# Patient Record
Sex: Male | Born: 1950 | Race: White | Hispanic: No | Marital: Single | Smoking: Never smoker
Health system: Southern US, Community
[De-identification: ages and names within clinical notes are randomized; demographics above are authoritative.]

## PROBLEM LIST (undated history)

## (undated) DIAGNOSIS — E785 Hyperlipidemia, unspecified: Secondary | ICD-10-CM

## (undated) DIAGNOSIS — I499 Cardiac arrhythmia, unspecified: Secondary | ICD-10-CM

## (undated) DIAGNOSIS — E039 Hypothyroidism, unspecified: Secondary | ICD-10-CM

## (undated) HISTORY — DX: Cardiac arrhythmia, unspecified: I49.9

## (undated) HISTORY — PX: NO PAST SURGERIES: SHX2092

## (undated) HISTORY — DX: Hyperlipidemia, unspecified: E78.5

## (undated) HISTORY — DX: Hypothyroidism, unspecified: E03.9

---

## 2014-02-18 ENCOUNTER — Encounter: Payer: Self-pay | Admitting: Family Medicine

## 2014-02-18 ENCOUNTER — Ambulatory Visit (INDEPENDENT_AMBULATORY_CARE_PROVIDER_SITE_OTHER): Payer: Medicare HMO | Admitting: Family Medicine

## 2014-02-18 VITALS — BP 125/87 | HR 72 | Temp 97.9°F | Ht 71.0 in | Wt 170.0 lb

## 2014-02-18 DIAGNOSIS — Z125 Encounter for screening for malignant neoplasm of prostate: Secondary | ICD-10-CM

## 2014-02-18 DIAGNOSIS — E039 Hypothyroidism, unspecified: Secondary | ICD-10-CM | POA: Insufficient documentation

## 2014-02-18 DIAGNOSIS — Z716 Tobacco abuse counseling: Secondary | ICD-10-CM | POA: Insufficient documentation

## 2014-02-18 DIAGNOSIS — F17200 Nicotine dependence, unspecified, uncomplicated: Secondary | ICD-10-CM

## 2014-02-18 DIAGNOSIS — Z130 Encounter for screening for diseases of the blood and blood-forming organs and certain disorders involving the immune mechanism: Secondary | ICD-10-CM

## 2014-02-18 DIAGNOSIS — E785 Hyperlipidemia, unspecified: Secondary | ICD-10-CM | POA: Insufficient documentation

## 2014-02-18 DIAGNOSIS — Z7189 Other specified counseling: Secondary | ICD-10-CM

## 2014-02-18 DIAGNOSIS — Z6825 Body mass index (BMI) 25.0-25.9, adult: Secondary | ICD-10-CM | POA: Insufficient documentation

## 2014-02-18 DIAGNOSIS — Z79899 Other long term (current) drug therapy: Secondary | ICD-10-CM

## 2014-02-18 LAB — COMPREHENSIVE METABOLIC PANEL
ALT: 15 U/L (ref 0–53)
AST: 15 U/L (ref 0–37)
Albumin: 4.5 g/dL (ref 3.5–5.2)
Alkaline Phosphatase: 68 U/L (ref 39–117)
BUN: 16 mg/dL (ref 6–23)
CALCIUM: 9.4 mg/dL (ref 8.4–10.5)
CO2: 26 meq/L (ref 19–32)
CREATININE: 1.02 mg/dL (ref 0.50–1.35)
Chloride: 106 mEq/L (ref 96–112)
Glucose, Bld: 91 mg/dL (ref 70–99)
POTASSIUM: 4.1 meq/L (ref 3.5–5.3)
Sodium: 141 mEq/L (ref 135–145)
TOTAL PROTEIN: 6.8 g/dL (ref 6.0–8.3)
Total Bilirubin: 0.8 mg/dL (ref 0.2–1.2)

## 2014-02-18 LAB — LIPID PANEL
CHOL/HDL RATIO: 3.2 ratio
Cholesterol: 165 mg/dL (ref 0–200)
HDL: 52 mg/dL (ref >39)
LDL Cholesterol: 90 mg/dL (ref 0–99)
Triglycerides: 115 mg/dL (ref <150)
VLDL: 23 mg/dL (ref 0–40)

## 2014-02-18 LAB — CBC
HCT: 45.3 % (ref 39.0–52.0)
Hemoglobin: 15.8 g/dL (ref 13.0–17.0)
MCH: 30 pg (ref 26.0–34.0)
MCHC: 34.9 g/dL (ref 30.0–36.0)
MCV: 86.1 fL (ref 78.0–100.0)
PLATELETS: 192 10*3/uL (ref 150–400)
RBC: 5.26 MIL/uL (ref 4.22–5.81)
RDW: 13.4 % (ref 11.5–15.5)
WBC: 5.4 10*3/uL (ref 4.0–10.5)

## 2014-02-18 LAB — TSH: TSH: 1.733 u[IU]/mL (ref 0.350–4.500)

## 2014-02-18 NOTE — Assessment & Plan Note (Signed)
>>  ASSESSMENT AND PLAN FOR HLD (HYPERLIPIDEMIA) WRITTEN ON 02/18/2014  8:22 PM BY STRE[address], Stephanie Coup, MD  A: Compliant with Mevacor. No side effects. Not sure of last liver function, but last lab draw was a couple of years ago at prior provider.  P: Lipid panel (non-fasting) drawn today. Will evaluate current ASCVD risk and consider change if indicated. Otherwise continue Mevacor for now; pt will need refill soon, but will order when decision is made to change to a different agent, or not.

## 2014-02-18 NOTE — Patient Instructions (Signed)
Front desk: Please schedule Mr. Carlos [name] with [facility] for a welcome to medicare visit. Thanks! --CMS  Thank you for coming in, today! It was nice to meet you!  I will check several labs today and call or write you a letter with the results. Use the stool cards as instructed. If they come back positive, we'll refer you to the GI doctor. I'll use your lab results to send in prescriptions as you need.  Come back to see me as you need. Make an appointment to see Lamont Dowdy for your welcome to Medicare visit. Please feel free to call with any questions or concerns at any time, at [phone]. --Dr. Venetia Maxon

## 2014-02-18 NOTE — Assessment & Plan Note (Signed)
Current smokeless tobacco user (chew). Not interested in quitting at the moment, but potentially interested in stopping in the future. Briefly counseled on risks of use, including oropharyngeal cancers, etc. Will continue counseling regularly at follow up and will continue to encourage complete cessation.

## 2014-02-18 NOTE — Assessment & Plan Note (Signed)
A: Apparently stable on current dose of Synthroid, without active or worrisome symptoms. Unsure exactly when last TSH was checked, but was reportedly slightly high, so last PCP increased from 50 mcg to 75 several months ago.  P: TSH drawn today. Continue Synthroid for now; pt will need refill soon, but will wait to see what TSH is to determine whether dose needs to be adjusted.

## 2014-02-18 NOTE — Progress Notes (Signed)
Subjective:    Patient ID: Carlos [name], male    DOB: 05/19/1951, 63 y.o.   MRN: [id number]  HPI: Pt presents to clinic to establish care. He generally feels well. He requests "a full lab work-up" as he gets things done "every two years," and is "due" for this. He also has questions about a welcome to Medicare visit and vaccinations / health screenings.  Hypothyroidism - pt reports he has been on Synthroid 75 mcg from his previous physician; denies side effects - denies weight changes, hair changes, skin changes, hot/cold intolerance, easy bleeding or bruising, or change in bowel habits  HLD - reports compliance with Mevacor, daily; last lipid panel was 'a couple of years ago' with his prior blood work - states his cholesterol has "always" been between 190 and 200, even with Mevacor - denies history of liver dysfunction or side effects from [employer] maintenance - has all recommended immunizations to this point (see immunizations flowsheet) - has not had a colonoscopy but is hesitant to get one at this point, preferring stool cards - had a PSA of 0.9 in 2013, with a family history of prostate cancer (father - deceased of this at 58, metastatic to bones and brain)  Family History  Problem Relation Age of Onset  . Alcoholism Other     Mother and Father  . COPD Brother     Emphysema  . Diabetes Brother     neither required medicine  . Prostate cancer Father     Spread to bones  . Heart disease Other     Grandparents  . Early death Other     Mother, 60 (PNA); Father, 67 (cancer)  . Hypertension Brother   . Hyperlipidemia Brother   . Thyroid disease Other     Brother and grandfather   Past Medical History  Diagnosis Date  . HLD (hyperlipidemia)   . Irregular heartbeat     self-reported "slow heartbeat"  . Hypothyroidism    Past Surgical History  Procedure Laterality Date  . No past surgeries      as of 02/2014    History   Social History  . Marital  Status: Single    Spouse Name: N/A    Number of Children: N/A  . Years of Education: N/A   Occupational History  . Not on file.   Social History Main Topics  . Smoking status: Never Smoker   . Smokeless tobacco: Current User    Types: Chew  . Alcohol Use: Not on file  . Drug Use: Not on file  . Sexual Activity: Not on file   Other Topics Concern  . Not on file   Social History Narrative  . No narrative on file   Pt currently uses smokeless tobacco and is not interested in quitting. In addition to the above documentation, pt's PMH, surgical history, FH, and SH all reviewed and updated where appropriate in the EMR. I have also reviewed and updated the pt's allergies and current medications as appropriate.  Review of Systems: As above. PHQ-2 negative. Otherwise, full 12-system ROS was reviewed and all negative.     Objective:   Physical Exam BP 125/87  Pulse 72  Temp(Src) 97.9 F (36.6 C) (Oral)  Ht 5\' 11"  (1.803 m)  Wt 170 lb (77.111 kg)  BMI 23.72 kg/m2 Gen: well-appearing adult male in NAD HEENT: [place]/AT, sclerae/conjunctivae clear, no lid lag, EOMI, PERRLA   MMM, posterior oropharynx clear, no cervical lymphadenopathy  neck supple  with full ROM, no masses appreciated; thyroid not enlarged  Cardio: RRR, no murmur appreciated; distal pulses intact/symmetric Pulm: CTAB, no wheezes, normal WOB  Abd: soft, nondistended, BS+, no HSM GU: declined rectal exam Ext: warm/well-perfused, no cyanosis/clubbing/edema MSK: strength 5/5 in all four extremities, no frank joint deformity/effusion  normal ROM to all four extremities with no point muscle/bony tenderness in spine Neuro/Psych: alert/oriented, sensation grossly intact; normal gait/balance  mood euthymic with congruent affect     Assessment & Plan:  See individual problem list notes re: hypothyroidism and HLD. Labs ordered:   FOBT (provided information for scheduling colonoscopy if desired)  CBC (screen for iron def.  Anemia)  CMP (current long-term use of statin)  TSH  PSA Pt to f/u for welcome to Medicare visit at his convenience. Otherwise f/u with me to be determined pending labs or as needed. MDM Justification for New Patient Level IV visit (83419):  Problem points: 4 (established problem, stable, x2 - HLD and hypothyroidism)  Risk: moderate (prescription drug management - Synthroid, Mevacor)

## 2014-02-18 NOTE — Assessment & Plan Note (Signed)
A: Compliant with Mevacor. No side effects. Not sure of last liver function, but last lab draw was a couple of years ago at prior provider.  P: Lipid panel (non-fasting) drawn today. Will evaluate current ASCVD risk and consider change if indicated. Otherwise continue Mevacor for now; pt will need refill soon, but will order when decision is made to change to a different agent, or not.

## 2014-02-19 ENCOUNTER — Other Ambulatory Visit: Payer: Self-pay | Admitting: Family Medicine

## 2014-02-19 ENCOUNTER — Telehealth: Payer: Self-pay | Admitting: Family Medicine

## 2014-02-19 LAB — PSA, MEDICARE: PSA: 0.81 ng/mL (ref <=4.00)

## 2014-02-19 MED ORDER — LEVOTHYROXINE SODIUM 75 MCG PO TABS: 75.0000 ug | ORAL_TABLET | Freq: Every day | ORAL | Status: DC

## 2014-02-19 MED ORDER — LOVASTATIN 20 MG PO TABS: 20.0000 mg | ORAL_TABLET | Freq: Every day | ORAL | Status: DC

## 2014-02-19 NOTE — Telephone Encounter (Signed)
Called pt to let him know that all labs returned normal; pt's daughter in law answered the phone, and following message relayed to her per pt permission, yesterday (he does not carry his phone, always). Will send in 1 month prescription to [facility] on [address] and 90-day supply to mail-order pharmacy, per pt request, for Mevacor and Synthroid. Pt can follow up with me as needed, otherwise. --CMS

## 2014-02-19 NOTE — Progress Notes (Signed)
Opened in error . disregard

## 2014-07-10 ENCOUNTER — Ambulatory Visit (INDEPENDENT_AMBULATORY_CARE_PROVIDER_SITE_OTHER): Payer: Commercial Managed Care - HMO | Admitting: Home Health Services

## 2014-07-10 ENCOUNTER — Encounter: Payer: Self-pay | Admitting: Home Health Services

## 2014-07-10 VITALS — BP 135/85 | HR 63 | Temp 97.2°F | Ht 71.0 in | Wt 177.0 lb

## 2014-07-10 DIAGNOSIS — Z1159 Encounter for screening for other viral diseases: Secondary | ICD-10-CM

## 2014-07-10 DIAGNOSIS — Z0000 Encounter for general adult medical examination without abnormal findings: Secondary | ICD-10-CM

## 2014-07-10 NOTE — Progress Notes (Signed)
Addendum: I have reviewed this visit and discussed with Lamont Dowdy and agree with her documentation.  Emmaline Kluver, MD PGY-3, [facility] 07/10/2014, 9:50 PM

## 2014-07-10 NOTE — Progress Notes (Signed)
Patient here for annual wellness visit, patient reports: Risk Factors/Conditions needing evaluation or treatment: Pt. Does not have any new risk factors that need evaluation.  Home Safety: Pt lives at home, by him self in a 1 story home.  Pt reports having smoke alarms and has some adaptive equipment.  Other Information: Corrective lens: Pt wears daily contact corrective lens, has annual eye exams at [facility] care. Dentures: Pt has full dentures. Memory: Pt denies memory problems.  Patient's Mini Mental Score (recorded in doc. flowsheet): 30 BMI/Exercise: We discussed BMI and strategies for weight maintenance including eating fruits/vegetables.  We also discussed  maintaining a regular exercise routine.  Med Adherence:  We discussed importance of taking medications for cholesterol.  Pt reports missing 0 day in the past week.  ADL/IADL:  Pt reports independence in all functions. Balance/Gait: Pt reports 0 falls in the past year.  We discussed home safety and fall prevention.   Pt requested HEP C screening, per verbal order from Dr. Venetia Maxon completed screening.  Colonscopy:  Discussed in length importance and how to get a colonoscopy done.  Pt reported getting flu vaccine 06/23/14 at [facility].      Annual Wellness Visit Requirements Recorded Today In  Medical, family, social history Past Medical, Family, Social History Section  Current providers Care team  Current medications Medications  Wt, BP, Ht, BMI Vital signs  Visual acuity (welcome visit) Progress Note  Hearing assessment (welcome visit) Hearing/vision  Tobacco, alcohol, illicit drug use History  ADL Nurse Assessment  Depression Screening Nurse Assessment  Cognitive impairment Document Flowsheet  Mini Mental Status Document Flowsheet  Fall Risk Fall/Depression  Home Safety Progress Note  End of Life Planning (welcome visit) Social Documentation  Medicare preventative services Health Maintenance  Risk  factors/conditions needing evaluation/treatment Progress Note  Personalized health advice Patient Instructions, goals, letter  Diet & Exercise Social Documentation  Emergency Contact Social Documentation  Seat Belts Social Documentation  Sun exposure/protection Social Documentation

## 2014-07-11 LAB — HEPATITIS C ANTIBODY: HCV Ab: NEGATIVE

## 2014-09-23 ENCOUNTER — Telehealth: Payer: Self-pay | Admitting: Family Medicine

## 2014-09-23 DIAGNOSIS — Z1211 Encounter for screening for malignant neoplasm of colon: Secondary | ICD-10-CM | POA: Insufficient documentation

## 2014-09-23 NOTE — Telephone Encounter (Signed)
Red Team: Please call pt back to let him know referral to GI has been ordered. He can follow up with me as needed. Thanks. --CMS

## 2014-09-23 NOTE — Telephone Encounter (Signed)
Pt called and needs a referral to have an coloscopy. Please let patient know when and where. jw

## 2014-09-23 NOTE — Telephone Encounter (Signed)
Referral placed in [place], they will contact patient with appointment.

## 2014-09-29 ENCOUNTER — Encounter: Payer: Self-pay | Admitting: Internal Medicine

## 2014-11-13 ENCOUNTER — Ambulatory Visit (AMBULATORY_SURGERY_CENTER): Payer: Self-pay

## 2014-11-13 VITALS — Ht 71.0 in | Wt 183.4 lb

## 2014-11-13 DIAGNOSIS — Z1211 Encounter for screening for malignant neoplasm of colon: Secondary | ICD-10-CM

## 2014-11-13 NOTE — Progress Notes (Signed)
No allergies to eggs or soy No diet/weight loss meds No home oxygen No past exposure to anesthesia  No email

## 2014-11-27 ENCOUNTER — Ambulatory Visit (AMBULATORY_SURGERY_CENTER): Payer: Commercial Managed Care - HMO | Admitting: Internal Medicine

## 2014-11-27 ENCOUNTER — Encounter: Payer: Self-pay | Admitting: Internal Medicine

## 2014-11-27 VITALS — BP 136/81 | HR 54 | Temp 96.6°F | Resp 21 | Ht 71.0 in | Wt 183.0 lb

## 2014-11-27 DIAGNOSIS — Z1211 Encounter for screening for malignant neoplasm of colon: Secondary | ICD-10-CM | POA: Diagnosis not present

## 2014-11-27 DIAGNOSIS — I10 Essential (primary) hypertension: Secondary | ICD-10-CM | POA: Diagnosis not present

## 2014-11-27 MED ORDER — SODIUM CHLORIDE 0.9 % IV SOLN: 500.0000 mL | INTRAVENOUS | Status: DC

## 2014-11-27 NOTE — Progress Notes (Signed)
A/ox3, pleased with MAC, report to RN 

## 2014-11-27 NOTE — Patient Instructions (Addendum)
You do not have any polyps! You do have diverticulosis - thickened muscle rings and pouches in the colon wall. Please read the handout about this condition.  Next routine colonoscopy in 10 years - 2026  I appreciate the opportunity to care for you. Gatha Mayer, MD, [facility]  Discharge instructions given. Handout on diverticulosis. Resume previous medications. YOU HAD AN ENDOSCOPIC PROCEDURE TODAY AT [facility]:   Refer to the procedure report that was given to you for any specific questions about what was found during the examination.  If the procedure report does not answer your questions, please call your gastroenterologist to clarify.  If you requested that your care partner not be given the details of your procedure findings, then the procedure report has been included in a sealed envelope for you to review at your convenience later.  YOU SHOULD EXPECT: Some feelings of bloating in the abdomen. Passage of more gas than usual.  Walking can help get rid of the air that was put into your GI tract during the procedure and reduce the bloating. If you had a lower endoscopy (such as a colonoscopy or flexible sigmoidoscopy) you may notice spotting of blood in your stool or on the toilet paper. If you underwent a bowel prep for your procedure, you may not have a normal bowel movement for a few days.  Please Note:  You might notice some irritation and congestion in your nose or some drainage.  This is from the oxygen used during your procedure.  There is no need for concern and it should clear up in a day or so.  SYMPTOMS TO REPORT IMMEDIATELY:   Following lower endoscopy (colonoscopy or flexible sigmoidoscopy):  Excessive amounts of blood in the stool  Significant tenderness or worsening of abdominal pains  Swelling of the abdomen that is new, acute  Fever of 100F or higher   For urgent or emergent issues, a gastroenterologist can be reached at any hour by calling  [phone].   DIET: Your first meal following the procedure should be a small meal and then it is ok to progress to your normal diet. Heavy or fried foods are harder to digest and may make you feel nauseous or bloated.  Likewise, meals heavy in dairy and vegetables can increase bloating.  Drink plenty of fluids but you should avoid alcoholic beverages for 24 hours.  ACTIVITY:  You should plan to take it easy for the rest of today and you should NOT DRIVE or use heavy machinery until tomorrow (because of the sedation medicines used during the test).    FOLLOW UP: Our staff will call the number listed on your records the next business day following your procedure to check on you and address any questions or concerns that you may have regarding the information given to you following your procedure. If we do not reach you, we will leave a message.  However, if you are feeling well and you are not experiencing any problems, there is no need to return our call.  We will assume that you have returned to your regular daily activities without incident.  If any biopsies were taken you will be contacted by phone or by letter within the next 1-3 weeks.  Please call us at [phone] if you have not heard about the biopsies in 3 weeks.    SIGNATURES/CONFIDENTIALITY: You and/or your care partner have signed paperwork which will be entered into your electronic medical record.  These signatures  attest to the fact that that the information above on your After Visit Summary has been reviewed and is understood.  Full responsibility of the confidentiality of this discharge information lies with you and/or your care-partner.

## 2014-11-27 NOTE — Op Note (Signed)
[place]  [address] [place], [id number]   COLONOSCOPY PROCEDURE REPORT  PATIENT: Carlos [name], Carlos [name]  MR#: [id number] BIRTHDATE: [birth year] , 29  yrs. old GENDER: male ENDOSCOPIST: Gatha Mayer, MD, [facility] PROCEDURE DATE:  11/27/2014 PROCEDURE:   Colonoscopy, screening First Screening Colonoscopy - Avg.  risk and is 50 yrs.  old or older Yes.  Prior Negative Screening - Now for repeat screening. N/A  History of Adenoma - Now for follow-up colonoscopy & has been > or = to 3 yrs.  N/A ASA CLASS:   Class II INDICATIONS:Screening for colonic neoplasia and Colorectal Neoplasm Risk Assessment for this procedure is average risk. MEDICATIONS: Propofol 200 mg IV and Monitored anesthesia care  DESCRIPTION OF PROCEDURE:   After the risks benefits and alternatives of the procedure were thoroughly explained, informed consent was obtained.  The digital rectal exam revealed no abnormalities of the rectum, revealed no prostatic nodules, and revealed the prostate was not enlarged.   The LB CF-H180AL Loaner [id number]  endoscope was introduced through the anus and advanced to the cecum, which was identified by both the appendix and ileocecal valve. No adverse events experienced.   The quality of the prep was (MiraLax was used) excellent.  The instrument was then slowly withdrawn as the colon was fully examined.      COLON FINDINGS: There was mild diverticulosis noted in the sigmoid colon.   The examination was otherwise normal.   Right colon retroflexion included.  Retroflexed views revealed no abnormalities. The time to cecum = 3.2 Withdrawal time = 8.6   The scope was withdrawn and the procedure completed. COMPLICATIONS: There were no immediate complications.  ENDOSCOPIC IMPRESSION: 1.   Mild diverticulosis was noted in the sigmoid colon 2.   The examination was otherwise normal - excellent prep - first screening  RECOMMENDATIONS: Repeat colonoscopy 10  years.  eSigned:  Gatha Mayer, MD, [facility] 11/27/2014 9:33 AM   cc: Christa See, MD and The Patient

## 2014-11-28 ENCOUNTER — Telehealth: Payer: Self-pay | Admitting: *Deleted

## 2014-11-28 NOTE — Telephone Encounter (Signed)
  Follow up Call-  Call back number 11/27/2014  Permission to leave phone message No    Called [phone]- no message left

## 2015-02-02 DIAGNOSIS — H524 Presbyopia: Secondary | ICD-10-CM | POA: Diagnosis not present

## 2015-02-02 DIAGNOSIS — Z0100 Encounter for examination of eyes and vision without abnormal findings: Secondary | ICD-10-CM | POA: Diagnosis not present

## 2015-02-02 DIAGNOSIS — H5213 Myopia, bilateral: Secondary | ICD-10-CM | POA: Diagnosis not present

## 2015-02-02 DIAGNOSIS — H52209 Unspecified astigmatism, unspecified eye: Secondary | ICD-10-CM | POA: Diagnosis not present

## 2015-03-04 ENCOUNTER — Ambulatory Visit (INDEPENDENT_AMBULATORY_CARE_PROVIDER_SITE_OTHER): Payer: Commercial Managed Care - HMO | Admitting: Family Medicine

## 2015-03-04 ENCOUNTER — Encounter: Payer: Self-pay | Admitting: Family Medicine

## 2015-03-04 VITALS — BP 123/84 | HR 93 | Temp 98.5°F | Ht 71.0 in | Wt 182.5 lb

## 2015-03-04 DIAGNOSIS — E785 Hyperlipidemia, unspecified: Secondary | ICD-10-CM | POA: Diagnosis not present

## 2015-03-04 DIAGNOSIS — Z125 Encounter for screening for malignant neoplasm of prostate: Secondary | ICD-10-CM

## 2015-03-04 DIAGNOSIS — E039 Hypothyroidism, unspecified: Secondary | ICD-10-CM

## 2015-03-04 DIAGNOSIS — Z6825 Body mass index (BMI) 25.0-25.9, adult: Secondary | ICD-10-CM | POA: Diagnosis not present

## 2015-03-04 DIAGNOSIS — Z130 Encounter for screening for diseases of the blood and blood-forming organs and certain disorders involving the immune mechanism: Secondary | ICD-10-CM

## 2015-03-04 DIAGNOSIS — Z0000 Encounter for general adult medical examination without abnormal findings: Secondary | ICD-10-CM

## 2015-03-04 LAB — CBC WITH DIFFERENTIAL/PLATELET
BASOS ABS: 0 10*3/uL (ref 0.0–0.1)
Basophils Relative: 1 % (ref 0–1)
Eosinophils Absolute: 0.2 10*3/uL (ref 0.0–0.7)
Eosinophils Relative: 5 % (ref 0–5)
HCT: 44.9 % (ref 39.0–52.0)
Hemoglobin: 15.2 g/dL (ref 13.0–17.0)
LYMPHS PCT: 23 % (ref 12–46)
Lymphs Abs: 1.1 10*3/uL (ref 0.7–4.0)
MCH: 29.9 pg (ref 26.0–34.0)
MCHC: 33.9 g/dL (ref 30.0–36.0)
MCV: 88.2 fL (ref 78.0–100.0)
MONO ABS: 0.6 10*3/uL (ref 0.1–1.0)
MPV: 11.8 fL (ref 8.6–12.4)
Monocytes Relative: 13 % — ABNORMAL HIGH (ref 3–12)
Neutro Abs: 2.7 10*3/uL (ref 1.7–7.7)
Neutrophils Relative %: 58 % (ref 43–77)
PLATELETS: 180 10*3/uL (ref 150–400)
RBC: 5.09 MIL/uL (ref 4.22–5.81)
RDW: 13.3 % (ref 11.5–15.5)
WBC: 4.6 10*3/uL (ref 4.0–10.5)

## 2015-03-04 MED ORDER — LEVOTHYROXINE SODIUM 75 MCG PO TABS
75.0000 ug | ORAL_TABLET | Freq: Every day | ORAL | Status: DC
Start: 2015-03-04 — End: 2015-12-03

## 2015-03-04 MED ORDER — LOVASTATIN 20 MG PO TABS: 20.0000 mg | ORAL_TABLET | Freq: Every day | ORAL | Status: DC

## 2015-03-04 NOTE — Progress Notes (Signed)
Subjective:    Patient ID: Carlos [name], male    DOB: 11/07/1950, 64 y.o.   MRN: [id number]  HPI: Pt presents for his annual physical.  He generally feels well. He has some occasional tinnitus but no unilateral hearing loss, pain, N/V, dizziness, or headaches.  Hypothyroidism - reports compliance with Synthroid 75 mcg daily without side effects; needs refill - denies weight changes, hair changes, skin changes, hot/cold intolerance, easy bleeding or bruising, or change in bowel habits  HLD - reports compliance with Mevacor 20 mg daily; needs refill - denies history of liver dysfunction or side effects from Carlos [name] maintenance - reports influenza and pneumococcal immunizations in the past several months - had a colonoscopy about 3 months ago which was normal other than a few diverticuli - last PSA ~1 year ago at 0.1; reports a family history of prostate cancer (father - deceased of this at 39, metastatic to bones and brain)  Family History  Problem Relation Age of Onset  . Alcoholism Other     Mother and Father  . COPD Brother     Emphysema  . Diabetes Brother     neither required medicine  . Prostate cancer Father     Spread to bones  . Heart disease Other     Grandparents  . Early death Other     Mother, 41 (PNA); Father, 4 (cancer)  . Hypertension Brother   . Hyperlipidemia Brother   . Thyroid disease Other     Brother and grandfather  . Colon cancer Neg Hx    Past Medical History  Diagnosis Date  . HLD (hyperlipidemia)   . Irregular heartbeat     self-reported "slow heartbeat"  . Hypothyroidism    Past Surgical History  Procedure Laterality Date  . No past surgeries      as of 02/2014    History   Social History  . Marital Status: Single    Spouse Name: N/A  . Number of Children: 1  . Years of Education: 13   Occupational History  . retired-brick yard     Social History Main Topics  . Smoking status: Never Smoker   . Smokeless  tobacco: Current User    Types: Chew  . Alcohol Use: No  . Drug Use: No  . Sexual Activity: Not on file   Other Topics Concern  . Not on file   Social History Narrative   Pt currently uses smokeless tobacco (chewing tobacco) and is not interested in quitting. In addition to the above documentation, pt's PMH, surgical history, FH, and SH all reviewed and updated where appropriate in the EMR. I have also reviewed and updated the pt's allergies and current medications as appropriate.  Review of Systems: As above. Otherwise, full 12-system ROS was reviewed and all negative.     Objective:   Physical Exam BP 123/84 mmHg  Pulse 93  Temp(Src) 98.5 F (36.9 C)  Ht 5\' 11"  (1.803 m)  Wt 182 lb 8 oz (82.781 kg)  BMI 25.46 kg/m2 Gen: well-appearing adult male in NAD HEENT: [place]/AT, sclerae/conjunctivae clear, no lid lag, EOMI, PERRLA   MMM, posterior oropharynx clear, no cervical lymphadenopathy  neck supple with full ROM, no masses appreciated; thyroid not enlarged  Cardio: RRR, no murmur appreciated; distal pulses intact/symmetric Pulm: CTAB, no wheezes, normal WOB  Abd: soft, nondistended, BS+, no HSM GU: declined rectal exam Ext: warm/well-perfused, no cyanosis/clubbing/edema MSK: strength 5/5 in all four extremities, no frank joint deformity/effusion  normal ROM to all four extremities with no point muscle/bony tenderness in spine Neuro/Psych: alert/oriented, sensation grossly intact; normal gait/balance  mood euthymic with congruent affect     Assessment & Plan:  64yo male in general good health; hx of HLD and hypothyroidism, doing well with Mevacor and Synthroid - refilled both medications - labs drawn as below  Anticipatory guidance / Risk factor reduction - still using chewing tobacco; counseled on quitting entirely if possible but pt declined formal cessation intervention - BMI just over 25; counseled on good diet / exercise habits - advised good yearly f/u with PCP with  as-needed visits otherwise  Immunization / screening / ancillary studies  - checking CMP, lipid panel, and TSH for hx of HLD and hypothyroidism - screening labs drawn today: CBC, PSA - up to date on screenings and immunizations, otherwise  F/u in 1 year for next wellness visit or otherwise as needed. PCP to be Dr. Gerarda Fraction as of July 1st.  [name] M Parilee Hally, MD PGY-3, [facility] 03/04/2015, 12:31 PM

## 2015-03-04 NOTE — Patient Instructions (Signed)
Thank you for coming in, today! It was nice to meet you!  I will check several labs today and call or write you a letter with the results. Keep taking your regular medicines without any changes, for now. If your doses need to change, I'll let you know. I sent in your Mevacor (lovastatin) to [facility] for 90 pills, and you should get lovastatin and levothyroxine in the mail, soon.  Come back to see [place] in 1 year, or sooner if you need. My last day is June 30th (about 10 days). After that, your doctor will be Dr. Gerarda Fraction. Please feel free to call with any questions or concerns at any time, at [phone]. --Dr. Venetia Maxon

## 2015-03-05 ENCOUNTER — Encounter: Payer: Self-pay | Admitting: Family Medicine

## 2015-03-05 LAB — COMPREHENSIVE METABOLIC PANEL
ALT: 14 U/L (ref 0–53)
AST: 14 U/L (ref 0–37)
Albumin: 4.2 g/dL (ref 3.5–5.2)
Alkaline Phosphatase: 67 U/L (ref 39–117)
BILIRUBIN TOTAL: 0.8 mg/dL (ref 0.2–1.2)
BUN: 14 mg/dL (ref 6–23)
CO2: 24 mEq/L (ref 19–32)
CREATININE: 0.9 mg/dL (ref 0.50–1.35)
Calcium: 9 mg/dL (ref 8.4–10.5)
Chloride: 106 mEq/L (ref 96–112)
Glucose, Bld: 94 mg/dL (ref 70–99)
Potassium: 4.2 mEq/L (ref 3.5–5.3)
Sodium: 140 mEq/L (ref 135–145)
Total Protein: 6.1 g/dL (ref 6.0–8.3)

## 2015-03-05 LAB — LIPID PANEL
CHOL/HDL RATIO: 3.6 ratio
Cholesterol: 156 mg/dL (ref 0–200)
HDL: 43 mg/dL (ref >=40)
LDL Cholesterol: 83 mg/dL (ref 0–99)
Triglycerides: 150 mg/dL — ABNORMAL HIGH (ref <150)
VLDL: 30 mg/dL (ref 0–40)

## 2015-03-05 LAB — TSH: TSH: 2.957 u[IU]/mL (ref 0.350–4.500)

## 2015-03-05 LAB — PSA, MEDICARE: PSA: 0.73 ng/mL (ref <=4.00)

## 2015-12-03 ENCOUNTER — Other Ambulatory Visit: Payer: Self-pay | Admitting: *Deleted

## 2015-12-04 MED ORDER — LEVOTHYROXINE SODIUM 75 MCG PO TABS: 75.0000 ug | ORAL_TABLET | Freq: Every day | ORAL | Status: DC

## 2015-12-04 NOTE — Telephone Encounter (Signed)
Will need PCP follow-up before next refill

## 2016-04-21 ENCOUNTER — Encounter: Payer: Self-pay | Admitting: Family Medicine

## 2016-04-21 ENCOUNTER — Ambulatory Visit (INDEPENDENT_AMBULATORY_CARE_PROVIDER_SITE_OTHER): Payer: Commercial Managed Care - HMO | Admitting: Family Medicine

## 2016-04-21 VITALS — BP 140/82 | HR 66 | Temp 97.9°F | Ht 71.0 in | Wt 184.0 lb

## 2016-04-21 DIAGNOSIS — I781 Nevus, non-neoplastic: Secondary | ICD-10-CM | POA: Diagnosis not present

## 2016-04-21 DIAGNOSIS — Z125 Encounter for screening for malignant neoplasm of prostate: Secondary | ICD-10-CM

## 2016-04-21 DIAGNOSIS — Z8042 Family history of malignant neoplasm of prostate: Secondary | ICD-10-CM | POA: Diagnosis not present

## 2016-04-21 DIAGNOSIS — E039 Hypothyroidism, unspecified: Secondary | ICD-10-CM | POA: Diagnosis not present

## 2016-04-21 DIAGNOSIS — E785 Hyperlipidemia, unspecified: Secondary | ICD-10-CM | POA: Diagnosis not present

## 2016-04-21 DIAGNOSIS — Z0000 Encounter for general adult medical examination without abnormal findings: Secondary | ICD-10-CM

## 2016-04-21 DIAGNOSIS — D224 Melanocytic nevi of scalp and neck: Secondary | ICD-10-CM | POA: Insufficient documentation

## 2016-04-21 DIAGNOSIS — Z112 Encounter for screening for other bacterial diseases: Secondary | ICD-10-CM | POA: Diagnosis not present

## 2016-04-21 LAB — CBC WITH DIFFERENTIAL/PLATELET
BASOS PCT: 1 %
Basophils Absolute: 53 cells/uL (ref 0–200)
EOS PCT: 5 %
Eosinophils Absolute: 265 cells/uL (ref 15–500)
HEMATOCRIT: 46.1 % (ref 38.5–50.0)
Hemoglobin: 15.4 g/dL (ref 13.2–17.1)
Lymphocytes Relative: 24 %
Lymphs Abs: 1272 cells/uL (ref 850–3900)
MCH: 30.1 pg (ref 27.0–33.0)
MCHC: 33.4 g/dL (ref 32.0–36.0)
MCV: 90.2 fL (ref 80.0–100.0)
MPV: 12.1 fL (ref 7.5–12.5)
Monocytes Absolute: 636 cells/uL (ref 200–950)
Monocytes Relative: 12 %
NEUTROS ABS: 3074 {cells}/uL (ref 1500–7800)
Neutrophils Relative %: 58 %
Platelets: 180 10*3/uL (ref 140–400)
RBC: 5.11 MIL/uL (ref 4.20–5.80)
RDW: 13.2 % (ref 11.0–15.0)
WBC: 5.3 10*3/uL (ref 3.8–10.8)

## 2016-04-21 LAB — LIPID PANEL
CHOL/HDL RATIO: 3.4 ratio (ref <=5.0)
Cholesterol: 155 mg/dL (ref 125–200)
HDL: 45 mg/dL (ref >=40)
LDL Cholesterol: 82 mg/dL (ref <130)
Triglycerides: 142 mg/dL (ref <150)
VLDL: 28 mg/dL (ref <30)

## 2016-04-21 LAB — BASIC METABOLIC PANEL
BUN: 14 mg/dL (ref 7–25)
CO2: 29 mmol/L (ref 20–31)
Calcium: 9.3 mg/dL (ref 8.6–10.3)
Chloride: 105 mmol/L (ref 98–110)
Creat: 0.98 mg/dL (ref 0.70–1.25)
Glucose, Bld: 95 mg/dL (ref 65–99)
POTASSIUM: 4.2 mmol/L (ref 3.5–5.3)
SODIUM: 140 mmol/L (ref 135–146)

## 2016-04-21 LAB — TSH: TSH: 3.6 m[IU]/L (ref 0.40–4.50)

## 2016-04-21 LAB — PSA: PSA: 0.7 ng/mL (ref <=4.0)

## 2016-04-21 MED ORDER — LOVASTATIN 20 MG PO TABS: 20.0000 mg | ORAL_TABLET | Freq: Every day | ORAL | 3 refills | Status: DC

## 2016-04-21 MED ORDER — LEVOTHYROXINE SODIUM 75 MCG PO TABS: 75.0000 ug | ORAL_TABLET | Freq: Every day | ORAL | 0 refills | Status: DC

## 2016-04-21 NOTE — Progress Notes (Signed)
   Subjective:    Patient ID: Carlos [name], male    DOB: 04/04/1951, 65 y.o.   MRN: [id number]   Carlos [name] is here today for his annual physical exam. He feels well. Concerns include some increased shortness of breath when he is chasing around his grandson. Denies chest pain. Also has some stomach pain if he laughs too hard with grandson. Reports getting yearly colds in the winter time that will give him a cough that lasts 4-6 weeks, cannot take medications with DXM in them as they make him nauseated. Unsure what cough medication would be best.  Hypothyroidism: Taking Synthroid 75 mcg daily, doing well, needs refill Denies fatigue, cold intolerance, dry skin, swelling, hair loss, constipation  Hyperlipidemia: taking Mevacor 20mg  daily, needs refill, no side effects  Healthcare maintenance: Reports being active every day, walks a lot, does yard work, weight is stable from last year Has not smoked 100 cigarettes in his life time, no need for AAA screening Needs flu shot this year Has FH of prostate cancer in father and brother, would like to follow PSA yearly Otherwise up to date on health screening- colonoscopy last year  Smoking status reviewed- non-smoker, currently chews tobacco 1 pouch a day  Review of Systems- see HPI   Objective:  BP 140/82 (BP Location: Left Arm, Patient Position: Sitting, Cuff Size: Normal)   Pulse 66   Temp 97.9 F (36.6 C) (Oral)   Ht 5\' 11"  (1.803 m)   Wt 184 lb (83.5 kg)   BMI 25.66 kg/m  Vitals and nursing note reviewed  General: well nourished elderly male, in no acute distress HEENT: normocephalic, no scleral icterus or conjunctival pallor, no nasal discharge, moist mucous membranes Cardiac: RRR, clear S1 and S2, no murmurs, rubs, or gallops Respiratory: clear to auscultation bilaterally, no increased work of breathing Abdomen: soft, nontender, nondistended, no masses or organomegaly. Bowel sounds present. No pulsatile mass  palpated. Extremities: no edema or cyanosis. Warm, well perfused. Skin: warm and dry, no rashes noted, scattered cherry hemangiomas present on stomach, large flesh colored nevus on side of neck with hair growth Neuro: alert and oriented, no focal deficits, 5/5 strength in upper and lower extremities, no sensory deficits   Assessment & Plan:   Healthcare Maintenance Overall patient is doing well and is healthy, he is up to date on healthcare maintenance. The following issues were addressed: -patient advised to get flu shot this year -encouraged exercise 30 min 3x a week -will check CBC due to SOB with exertion to rule out anemia -patient encouraged to come in if he gets ill this winter for cough medicine, otherwise recommended taking Vitamin C supplement once winter starts  Hypothyroidism  Will check TSH today Continue with Synthroid 100mcg daily, if adjustments need to be made I will contact patient/change dosage  HLD (hyperlipidemia)  Doing well on Mevacor 20mg  daily  -Will check lipid panel and BMP today  Family history of prostate cancer  PSA has been checked yearly, within normal range, patient's brother has prostate cancer and his father died from metastatic prostate cancer  -Will check PSA today and yearly  Nevus of neck  Not concerning for malignancy, has not changed in size or color, reassuring in appearance. Is bothersome to patient as he cuts it shaving occasionally  -Referral to derm for removal, per patient's preference    [name], DO Family Medicine Resident PGY-1

## 2016-04-21 NOTE — Assessment & Plan Note (Signed)
  Not concerning for malignancy, has not changed in size or color, reassuring in appearance. Is bothersome to patient as he cuts it shaving occasionally  -Referral to derm for removal, per patient's preference

## 2016-04-21 NOTE — Assessment & Plan Note (Signed)
  PSA has been checked yearly, within normal range, patient's brother has prostate cancer and his father died from metastatic prostate cancer  -Will check PSA today and yearly

## 2016-04-21 NOTE — Patient Instructions (Signed)
  Please get your flu shot this fall  I will send you a letter with lab results  Call or come back if you need [place]!

## 2016-04-21 NOTE — Assessment & Plan Note (Signed)
>>  ASSESSMENT AND PLAN FOR HLD (HYPERLIPIDEMIA) WRITTEN ON 04/21/2016 11:47 AM BY RICCIO, ANGELA C, DO   Doing well on Mevacor 20mg  daily  -Will check lipid panel and BMP today

## 2016-04-21 NOTE — Assessment & Plan Note (Signed)
  Will check TSH today Continue with Synthroid 15mcg daily, if adjustments need to be made I will contact patient/change dosage

## 2016-04-21 NOTE — Assessment & Plan Note (Signed)
  Doing well on Mevacor 20mg  daily  -Will check lipid panel and BMP today

## 2016-04-22 ENCOUNTER — Encounter: Payer: Self-pay | Admitting: Family Medicine

## 2016-05-02 DIAGNOSIS — H5210 Myopia, unspecified eye: Secondary | ICD-10-CM | POA: Diagnosis not present

## 2016-05-02 DIAGNOSIS — H524 Presbyopia: Secondary | ICD-10-CM | POA: Diagnosis not present

## 2016-05-04 ENCOUNTER — Telehealth: Payer: Self-pay | Admitting: Family Medicine

## 2016-05-04 DIAGNOSIS — E039 Hypothyroidism, unspecified: Secondary | ICD-10-CM

## 2016-05-04 NOTE — Telephone Encounter (Signed)
When he received his levothyroxine, it did not have any refills. He did receive a 90 day supply. He uses the [facility]. Please advise

## 2016-05-05 MED ORDER — LEVOTHYROXINE SODIUM 75 MCG PO TABS: 75.0000 ug | ORAL_TABLET | Freq: Every day | ORAL | 4 refills | Status: DC

## 2016-05-05 NOTE — Telephone Encounter (Signed)
Added additional refills to last him until next year's physical.

## 2016-10-24 DIAGNOSIS — H04123 Dry eye syndrome of bilateral lacrimal glands: Secondary | ICD-10-CM | POA: Diagnosis not present

## 2016-10-24 DIAGNOSIS — H5213 Myopia, bilateral: Secondary | ICD-10-CM | POA: Diagnosis not present

## 2016-12-12 DIAGNOSIS — Z0100 Encounter for examination of eyes and vision without abnormal findings: Secondary | ICD-10-CM | POA: Diagnosis not present

## 2016-12-15 ENCOUNTER — Encounter: Payer: Self-pay | Admitting: Adult Health

## 2016-12-15 ENCOUNTER — Ambulatory Visit (INDEPENDENT_AMBULATORY_CARE_PROVIDER_SITE_OTHER): Payer: Medicare HMO | Admitting: Adult Health

## 2016-12-15 VITALS — BP 116/70 | HR 64 | Ht 71.0 in | Wt 182.2 lb

## 2016-12-15 DIAGNOSIS — E039 Hypothyroidism, unspecified: Secondary | ICD-10-CM

## 2016-12-15 DIAGNOSIS — E785 Hyperlipidemia, unspecified: Secondary | ICD-10-CM | POA: Diagnosis not present

## 2016-12-15 DIAGNOSIS — Z0000 Encounter for general adult medical examination without abnormal findings: Secondary | ICD-10-CM | POA: Diagnosis not present

## 2016-12-15 NOTE — Assessment & Plan Note (Signed)
Continue Lovastatin 20mg  daily. Heart healthy diet and continue regular movement. Will re-check lipid panel ing August 2018.

## 2016-12-15 NOTE — Assessment & Plan Note (Signed)
Continue Levothyroxine 55mcg daily. Will re-check TSH in August 2018.

## 2016-12-15 NOTE — Assessment & Plan Note (Signed)
Heart healthy diet Continue medications as directed. Annual CPE/fasting labs August 2018. Please call clinic with any questions/concerns.

## 2016-12-15 NOTE — Assessment & Plan Note (Signed)
>>  ASSESSMENT AND PLAN FOR HLD (HYPERLIPIDEMIA) WRITTEN ON 12/15/2016  1:39 PM BY BESS, KATY D, NP  Continue Lovastatin 20mg  daily. Heart healthy diet and continue regular movement. Will re-check lipid panel ing August 2018.

## 2016-12-15 NOTE — Patient Instructions (Signed)
Hypothyroidism Hypothyroidism is a disorder of the thyroid. The thyroid is a large gland that is located in the lower front of the neck. The thyroid releases hormones that control how the body works. With hypothyroidism, the thyroid does not make enough of these hormones. What are the causes? Causes of hypothyroidism may include:  Viral infections.  Pregnancy.  Your own defense system (immune system) attacking your thyroid.  Certain medicines.  Birth defects.  Past radiation treatments to your head or neck.  Past treatment with radioactive iodine.  Past surgical removal of part or all of your thyroid.  Problems with the gland that is located in the center of your brain (pituitary). What are the signs or symptoms? Signs and symptoms of hypothyroidism may include:  Feeling as though you have no energy (lethargy).  Inability to tolerate cold.  Weight gain that is not explained by a change in diet or exercise habits.  Dry skin.  Coarse hair.  Menstrual irregularity.  Slowing of thought processes.  Constipation.  Sadness or depression. How is this diagnosed? Your health care provider may diagnose hypothyroidism with blood tests and ultrasound tests. How is this treated? Hypothyroidism is treated with medicine that replaces the hormones that your body does not make. After you begin treatment, it may take several weeks for symptoms to go away. Follow these instructions at home:  Take medicines only as directed by your health care provider.  If you start taking any new medicines, tell your health care provider.  Keep all follow-up visits as directed by your health care provider. This is important. As your condition improves, your dosage needs may change. You will need to have blood tests regularly so that your health care provider can watch your condition. Contact a health care provider if:  Your symptoms do not get better with treatment.  You are taking thyroid  replacement medicine and:  You sweat excessively.  You have tremors.  You feel anxious.  You lose weight rapidly.  You cannot tolerate heat.  You have emotional swings.  You have diarrhea.  You feel weak. Get help right away if:  You develop chest pain.  You develop an irregular heartbeat.  You develop a rapid heartbeat. This information is not intended to replace advice given to you by your health care provider. Make sure you discuss any questions you have with your health care provider. Document Released: 08/29/2005 Document Revised: 02/04/2016 Document Reviewed: 01/14/2014 Elsevier Interactive Patient Education  2017 [place]. Heart-Healthy Eating Plan Many factors influence your heart health, including eating and exercise habits. Heart (coronary) risk increases with abnormal blood fat (lipid) levels. Heart-healthy meal planning includes limiting unhealthy fats, increasing healthy fats, and making other small dietary changes. This includes maintaining a healthy body weight to help keep lipid levels within a normal range. What is my plan? Your health care provider recommends that you:  Get no more than _________% of the total calories in your daily diet from fat.  Limit your intake of saturated fat to less than _________% of your total calories each day.  Limit the amount of cholesterol in your diet to less than _________ mg per day. What types of fat should I choose?  Choose healthy fats more often. Choose monounsaturated and polyunsaturated fats, such as olive oil and canola oil, flaxseeds, walnuts, almonds, and seeds.  Eat more omega-3 fats. Good choices include salmon, mackerel, sardines, tuna, flaxseed oil, and ground flaxseeds. Aim to eat fish at least two times each week.  Limit saturated fats. Saturated fats are primarily found in animal products, such as meats, butter, and cream. Plant sources of saturated fats include palm oil, palm kernel oil, and coconut  oil.  Avoid foods with partially hydrogenated oils in them. These contain trans fats. Examples of foods that contain trans fats are stick margarine, some tub margarines, cookies, crackers, and other baked goods. What general guidelines do I need to follow?  Check food labels carefully to identify foods with trans fats or high amounts of saturated fat.  Fill one half of your plate with vegetables and green salads. Eat 4-5 servings of vegetables per day. A serving of vegetables equals 1 cup of raw leafy vegetables,  cup of raw or cooked cut-up vegetables, or  cup of vegetable juice.  Fill one fourth of your plate with whole grains. Look for the word "whole" as the first word in the ingredient list.  Fill one fourth of your plate with lean protein foods.  Eat 4-5 servings of fruit per day. A serving of fruit equals one medium whole fruit,  cup of dried fruit,  cup of fresh, frozen, or canned fruit, or  cup of 100% fruit juice.  Eat more foods that contain soluble fiber. Examples of foods that contain this type of fiber are apples, broccoli, carrots, beans, peas, and barley. Aim to get 20-30 g of fiber per day.  Eat more home-cooked food and less restaurant, buffet, and fast food.  Limit or avoid alcohol.  Limit foods that are high in starch and sugar.  Avoid fried foods.  Cook foods by using methods other than frying. Baking, boiling, grilling, and broiling are all great options. Other fat-reducing suggestions include:  Removing the skin from poultry.  Removing all visible fats from meats.  Skimming the fat off of stews, soups, and gravies before serving them.  Steaming vegetables in water or broth.  Lose weight if you are overweight. Losing just 5-10% of your initial body weight can help your overall health and prevent diseases such as diabetes and heart disease.  Increase your consumption of nuts, legumes, and seeds to 4-5 servings per week. One serving of dried beans or  legumes equals  cup after being cooked, one serving of nuts equals 1 ounces, and one serving of seeds equals  ounce or 1 tablespoon.  You may need to monitor your salt (sodium) intake, especially if you have high blood pressure. Talk with your health care provider or dietitian to get more information about reducing sodium. What foods can I eat? Grains   Breads, including [place], white, pita, wheat, raisin, rye, oatmeal, and [place]. Tortillas that are neither fried nor made with lard or trans fat. Low-fat rolls, including hotdog and hamburger buns and English muffins. Biscuits. Muffins. Waffles. Pancakes. Light popcorn. Whole-grain cereals. Flatbread. Melba toast. Pretzels. Breadsticks. Rusks. Low-fat snacks and crackers, including oyster, saltine, matzo, graham, animal, and rye. Rice and pasta, including brown rice and those that are made with whole wheat. Vegetables  All vegetables. Fruits  All fruits, but limit coconut. Meats and Other Protein Sources  Lean, well-trimmed beef, veal, pork, and lamb. Chicken and [place] without skin. All fish and shellfish. Wild duck, rabbit, pheasant, and venison. Egg whites or low-cholesterol egg substitutes. Dried beans, peas, lentils, and tofu.Seeds and most nuts. Dairy  Low-fat or nonfat cheeses, including ricotta, string, and mozzarella. Skim or 1% milk that is liquid, powdered, or evaporated. Buttermilk that is made with low-fat milk. Nonfat or low-fat yogurt. Beverages  Mineral water. Diet carbonated beverages. Sweets and Desserts  Sherbets and fruit ices. Honey, jam, marmalade, jelly, and syrups. Meringues and gelatins. Pure sugar candy, such as hard candy, jelly beans, gumdrops, mints, marshmallows, and small amounts of dark chocolate. [employer] Eat all sweets and desserts in moderation. Fats and Oils  Nonhydrogenated (trans-free) margarines. Vegetable oils, including soybean, sesame, sunflower, olive, peanut, safflower, corn, canola, and  cottonseed. Salad dressings or mayonnaise that are made with a vegetable oil. Limit added fats and oils that you use for cooking, baking, salads, and as spreads. Other  Cocoa powder. Coffee and tea. All seasonings and condiments. The items listed above may not be a complete list of recommended foods or beverages. Contact your dietitian for more options.  What foods are not recommended? Grains  Breads that are made with saturated or trans fats, oils, or whole milk. Croissants. Butter rolls. Cheese breads. Sweet rolls. Donuts. Buttered popcorn. Chow mein noodles. High-fat crackers, such as cheese or butter crackers. Meats and Other Protein Sources  Fatty meats, such as hotdogs, short ribs, sausage, spareribs, bacon, ribeye roast or steak, and mutton. High-fat deli meats, such as salami and bologna. Caviar. Domestic duck and goose. Organ meats, such as kidney, liver, sweetbreads, brains, gizzard, chitterlings, and heart. Dairy  Cream, sour cream, cream cheese, and creamed cottage cheese. Whole milk cheeses, including blue (bleu), [place], [place], [place], [place], [place], [place], [place], [place], and [place]. Whole or 2% milk that is liquid, evaporated, or condensed. Whole buttermilk. Cream sauce or high-fat cheese sauce. Yogurt that is made from whole milk. Beverages  Regular sodas and drinks with added sugar. Sweets and Desserts  Frosting. Pudding. Cookies. Cakes other than angel food cake. Candy that has milk chocolate or white chocolate, hydrogenated fat, butter, coconut, or unknown ingredients. Buttered syrups. Full-fat ice cream or ice cream drinks. Fats and Oils  Gravy that has suet, meat fat, or shortening. Cocoa butter, hydrogenated oils, palm oil, coconut oil, palm kernel oil. These can often be found in baked products, candy, fried foods, nondairy creamers, and whipped toppings. Solid fats and shortenings, including bacon fat, salt pork, lard, and butter. Nondairy cream substitutes,  such as coffee creamers and sour cream substitutes. Salad dressings that are made of unknown oils, cheese, or sour cream. The items listed above may not be a complete list of foods and beverages to avoid. Contact your dietitian for more information.  This information is not intended to replace advice given to you by your health care provider. Make sure you discuss any questions you have with your health care provider. Document Released: 06/07/2008 Document Revised: 03/18/2016 Document Reviewed: 02/20/2014 Elsevier Interactive Patient Education  2017 [place] all medications as directed. Please schedule annual physical/fasting labs in August 2018. Please call clinic with any questions/concerns.

## 2016-12-15 NOTE — Progress Notes (Addendum)
Subjective:    Patient ID: Carlos [name], male    DOB: 1951-06-20, 66 y.o.   MRN: [id number]  HPI:  Carlos [name] presents to establish as a new pt. He is a very pleasant 66 year old man.  PMH: Hypothyroidism and elevated cholesterol.  He is compliant on all medications and denies SE. He reports drinking "lots of fluids", specifically water, gatorade/propel, and "one soda pop a day".   Family hx significant for prostate cancer-father and brother.  Carlos [name] denies any GU sx's and reports his PSA runs <1. Grandson at [facility] today.   Patient Care Team    Relationship Specialty Notifications Start End  Carlos Aquas, NP PCP - General Family Medicine  12/15/16   Carlos Mayer, MD Consulting Physician Gastroenterology  12/15/16     Patient Active Problem List   Diagnosis Date Noted  . Family history of prostate cancer 04/21/2016  . Nevus of neck 04/21/2016  . Colon cancer screening 09/23/2014  . HLD (hyperlipidemia) 02/18/2014  . Hypothyroidism 02/18/2014  . BMI 25.0-25.9,adult 02/18/2014  . Tobacco abuse counseling 02/18/2014     Past Medical History:  Diagnosis Date  . HLD (hyperlipidemia)   . Hypothyroidism   . Irregular heartbeat    self-reported "slow heartbeat"     Past Surgical History:  Procedure Laterality Date  . NO PAST SURGERIES     as of 02/2014     Family History  Problem Relation Age of Onset  . Prostate cancer Father     Spread to bones  . Alcoholism Other     Mother and Father  . COPD Brother     Emphysema  . Diabetes Brother     neither required medicine  . Heart disease Other     Grandparents  . Early death Other     Mother, 55 (PNA); Father, 70 (cancer)  . Hypertension Brother   . Hyperlipidemia Brother   . Thyroid disease Other     Brother and grandfather  . Colon cancer Neg Hx      History  Drug Use No     History  Alcohol Use No     History  Smoking Status  . Never Smoker  Smokeless Tobacco  . Current User  . Types:  Chew     Outpatient Encounter Prescriptions as of 68/01/2017  Medication Sig  . aspirin 81 MG tablet Take 81 mg by mouth daily.  Marland Kitchen levothyroxine (SYNTHROID, LEVOTHROID) 75 MCG tablet Take 1 tablet (75 mcg total) by mouth daily.  Marland Kitchen lovastatin (MEVACOR) 20 MG tablet Take 1 tablet (20 mg total) by mouth at bedtime.  Marland Kitchen OVER THE COUNTER MEDICATION Take 200 mcg by mouth daily. SELENIUM SUPPLEMENT - OTC DAILY  . OVER THE COUNTER MEDICATION Take 2.5 g by mouth daily. SPIRULINA - OTC DAILY  . OVER THE COUNTER MEDICATION Take 2 g by mouth daily. BARLEY GRASS - OTC DAILY  . OVER THE COUNTER MEDICATION Turmeric 450mg  2-3 times per week  . Probiotic Product (SOLUBLE FIBER/PROBIOTICS PO) Take 1 capsule by mouth 1 day or 1 dose.  . vitamin E 200 UNIT capsule Take 200 Units by mouth daily.   No facility-administered encounter medications on file as of 68/01/2017.     Allergies: Patient has no known allergies.  Body mass index is 25.41 kg/m.  Blood pressure 116/70, pulse 64, height 5\' 11"  (1.803 m), weight 182 lb 3.2 oz (82.6 kg).    Review of Systems  Constitutional: Negative for  activity change, appetite change, chills, diaphoresis, fatigue, fever and unexpected weight change.  HENT: Negative for congestion.   Respiratory: Negative for cough, chest tightness, shortness of breath, wheezing and stridor.   Cardiovascular: Negative for chest pain, palpitations and leg swelling.  Gastrointestinal: Negative for abdominal distention, abdominal pain, blood in stool, constipation, diarrhea, nausea and vomiting.  Endocrine: Negative for cold intolerance, heat intolerance, polydipsia, polyphagia and polyuria.  Genitourinary: Negative for decreased urine volume, difficulty urinating, dysuria and flank pain.  Musculoskeletal: Negative for arthralgias, back pain, gait problem, joint swelling, myalgias, neck pain and neck stiffness.  Skin: Negative for color change, pallor, rash and wound.  Neurological:  Negative for dizziness, tremors, weakness, light-headedness and headaches.  Hematological: Does not bruise/bleed easily.  Psychiatric/Behavioral: Negative for agitation, dysphoric mood and sleep disturbance. The patient is not nervous/anxious.        Objective:   Physical Exam  Constitutional: He is oriented to person, place, and time. He appears well-developed and well-nourished. No distress.  HENT:  Head: Normocephalic and atraumatic.  Right Ear: Hearing, tympanic membrane and external ear normal. No decreased hearing is noted.  Left Ear: Hearing, tympanic membrane and external ear normal. No decreased hearing is noted.  Nose: Nose normal. Right sinus exhibits no maxillary sinus tenderness and no frontal sinus tenderness. Left sinus exhibits no maxillary sinus tenderness and no frontal sinus tenderness.  Mouth/Throat: Uvula is midline. He has dentures. Abnormal dentition.  Not wearing dentures today.  Speech was a bit garbled, however comprehensible.  Eyes: Conjunctivae are normal. Pupils are equal, round, and reactive to light.  Neck: Normal range of motion. Neck supple.  Cardiovascular: Normal rate, regular rhythm and intact distal pulses.   Murmur heard. Pulmonary/Chest: Effort normal and breath sounds normal. No respiratory distress. He has no wheezes. He has no rales. He exhibits no tenderness.  Abdominal: Soft. Bowel sounds are normal. He exhibits no distension and no mass. There is no tenderness. There is no rebound and no guarding.  Musculoskeletal: Normal range of motion.  Lymphadenopathy:    He has no cervical adenopathy.  Neurological: He is alert and oriented to person, place, and time. Coordination normal.  Skin: Skin is warm and dry. No rash noted. He is not diaphoretic. No erythema. No pallor.  Psychiatric: He has a normal mood and affect. His behavior is normal. Judgment and thought content normal.          Assessment & Plan:   1. Hypothyroidism, unspecified type    2. Hyperlipidemia, unspecified hyperlipidemia type   3. Health care maintenance     Hypothyroidism Continue Levothyroxine 84mcg daily. Will re-check TSH in August 2018.  HLD (hyperlipidemia) Continue Lovastatin 20mg  daily. Heart healthy diet and continue regular movement. Will re-check lipid panel ing August 2018.  Health care maintenance Heart healthy diet Continue medications as directed. Annual CPE/fasting labs August 2018. Please call clinic with any questions/concerns.    FOLLOW-UP:  Return in about 4 months (around 04/17/2017) for Regular Follow Up, Fasting Lab Draw.

## 2017-05-17 ENCOUNTER — Other Ambulatory Visit: Payer: Self-pay

## 2017-05-17 DIAGNOSIS — Z8042 Family history of malignant neoplasm of prostate: Secondary | ICD-10-CM

## 2017-05-17 DIAGNOSIS — Z0000 Encounter for general adult medical examination without abnormal findings: Secondary | ICD-10-CM

## 2017-05-17 DIAGNOSIS — E039 Hypothyroidism, unspecified: Secondary | ICD-10-CM

## 2017-05-17 DIAGNOSIS — E7849 Other hyperlipidemia: Secondary | ICD-10-CM

## 2017-05-18 ENCOUNTER — Ambulatory Visit (INDEPENDENT_AMBULATORY_CARE_PROVIDER_SITE_OTHER): Payer: Medicare HMO | Admitting: Adult Health

## 2017-05-18 ENCOUNTER — Encounter: Payer: Self-pay | Admitting: Adult Health

## 2017-05-18 VITALS — BP 113/70 | HR 71 | Ht 71.0 in | Wt 177.2 lb

## 2017-05-18 DIAGNOSIS — E038 Other specified hypothyroidism: Secondary | ICD-10-CM | POA: Diagnosis not present

## 2017-05-18 DIAGNOSIS — Z833 Family history of diabetes mellitus: Secondary | ICD-10-CM | POA: Diagnosis not present

## 2017-05-18 DIAGNOSIS — Z8042 Family history of malignant neoplasm of prostate: Secondary | ICD-10-CM

## 2017-05-18 DIAGNOSIS — Z1211 Encounter for screening for malignant neoplasm of colon: Secondary | ICD-10-CM

## 2017-05-18 DIAGNOSIS — R011 Cardiac murmur, unspecified: Secondary | ICD-10-CM | POA: Diagnosis not present

## 2017-05-18 DIAGNOSIS — Z0000 Encounter for general adult medical examination without abnormal findings: Secondary | ICD-10-CM

## 2017-05-18 DIAGNOSIS — E7849 Other hyperlipidemia: Secondary | ICD-10-CM

## 2017-05-18 DIAGNOSIS — Z716 Tobacco abuse counseling: Secondary | ICD-10-CM

## 2017-05-18 DIAGNOSIS — E784 Other hyperlipidemia: Secondary | ICD-10-CM | POA: Diagnosis not present

## 2017-05-18 DIAGNOSIS — E039 Hypothyroidism, unspecified: Secondary | ICD-10-CM

## 2017-05-18 NOTE — Assessment & Plan Note (Addendum)
Denies current urinary sx's, PSA level drawn today. Denies sexual intercourse or riding a motorcycle in last 72 hrs prior to PSA drawn this morning.

## 2017-05-18 NOTE — Assessment & Plan Note (Signed)
Fastin lipid panel drawn today. Continue heart healthy diet and lovastatin 20mg  daily.

## 2017-05-18 NOTE — Assessment & Plan Note (Signed)
>>  ASSESSMENT AND PLAN FOR HLD (HYPERLIPIDEMIA) WRITTEN ON 05/18/2017 11:35 AM BY DANFORD, KATY D, NP  Fastin lipid panel drawn today. Continue heart healthy diet and lovastatin 20mg  daily.

## 2017-05-18 NOTE — Assessment & Plan Note (Signed)
Reports hx of murmur detected while in [employer]. Murmur not appreciated at initial OV in 12/2016, nor documented in previous PEs with other providers. EKG completed today- SB with SA Results discussed with pt.

## 2017-05-18 NOTE — Assessment & Plan Note (Signed)
Declined DRE with FOBT screening at today's OV. He had screening colonoscopy completed 11/2014, instructed to repeat in 10 years. Follow heart healthy diet.

## 2017-05-18 NOTE — Assessment & Plan Note (Signed)
Denies smoking tobacco, however daily use of chewing tobacco.  Declined tobacco cessation today.

## 2017-05-18 NOTE — Patient Instructions (Addendum)
Heart-Healthy Eating Plan Many factors influence your heart health, including eating and exercise habits. Heart (coronary) risk increases with abnormal blood fat (lipid) levels. Heart-healthy meal planning includes limiting unhealthy fats, increasing healthy fats, and making other small dietary changes. This includes maintaining a healthy body weight to help keep lipid levels within a normal range. What is my plan? Your health care provider recommends that you:  Get no more than __25__% of the total calories in your daily diet from fat.  Limit your intake of saturated fat to less than __5__% of your total calories each day.  Limit the amount of cholesterol in your diet to less than _300_ mg per day.  What types of fat should I choose?  Choose healthy fats more often. Choose monounsaturated and polyunsaturated fats, such as olive oil and canola oil, flaxseeds, walnuts, almonds, and seeds.  Eat more omega-3 fats. Good choices include salmon, mackerel, sardines, tuna, flaxseed oil, and ground flaxseeds. Aim to eat fish at least two times each week.  Limit saturated fats. Saturated fats are primarily found in animal products, such as meats, butter, and cream. Plant sources of saturated fats include palm oil, palm kernel oil, and coconut oil.  Avoid foods with partially hydrogenated oils in them. These contain trans fats. Examples of foods that contain trans fats are stick margarine, some tub margarines, cookies, crackers, and other baked goods. What general guidelines do I need to follow?  Check food labels carefully to identify foods with trans fats or high amounts of saturated fat.  Fill one half of your plate with vegetables and green salads. Eat 4-5 servings of vegetables per day. A serving of vegetables equals 1 cup of raw leafy vegetables,  cup of raw or cooked cut-up vegetables, or  cup of vegetable juice.  Fill one fourth of your plate with whole grains. Look for the word "whole" as  the first word in the ingredient list.  Fill one fourth of your plate with lean protein foods.  Eat 4-5 servings of fruit per day. A serving of fruit equals one medium whole fruit,  cup of dried fruit,  cup of fresh, frozen, or canned fruit, or  cup of 100% fruit juice.  Eat more foods that contain soluble fiber. Examples of foods that contain this type of fiber are apples, broccoli, carrots, beans, peas, and barley. Aim to get 20-30 g of fiber per day.  Eat more home-cooked food and less restaurant, buffet, and fast food.  Limit or avoid alcohol.  Limit foods that are high in starch and sugar.  Avoid fried foods.  Cook foods by using methods other than frying. Baking, boiling, grilling, and broiling are all great options. Other fat-reducing suggestions include: ? Removing the skin from poultry. ? Removing all visible fats from meats. ? Skimming the fat off of stews, soups, and gravies before serving them. ? Steaming vegetables in water or broth.  Lose weight if you are overweight. Losing just 5-10% of your initial body weight can help your overall health and prevent diseases such as diabetes and heart disease.  Increase your consumption of nuts, legumes, and seeds to 4-5 servings per week. One serving of dried beans or legumes equals  cup after being cooked, one serving of nuts equals 1 ounces, and one serving of seeds equals  ounce or 1 tablespoon.  You may need to monitor your salt (sodium) intake, especially if you have high blood pressure. Talk with your health care provider or dietitian to get  more information about reducing sodium. What foods can I eat? Grains  Breads, including [place], white, pita, wheat, raisin, rye, oatmeal, and [place]. Tortillas that are neither fried nor made with lard or trans fat. Low-fat rolls, including hotdog and hamburger buns and English muffins. Biscuits. Muffins. Waffles. Pancakes. Light popcorn. Whole-grain cereals. Flatbread. Melba toast.  Pretzels. Breadsticks. Rusks. Low-fat snacks and crackers, including oyster, saltine, matzo, graham, animal, and rye. Rice and pasta, including brown rice and those that are made with whole wheat. Vegetables All vegetables. Fruits All fruits, but limit coconut. Meats and Other Protein Sources Lean, well-trimmed beef, veal, pork, and lamb. Chicken and [place] without skin. All fish and shellfish. Wild duck, rabbit, pheasant, and venison. Egg whites or low-cholesterol egg substitutes. Dried beans, peas, lentils, and tofu.Seeds and most nuts. Dairy Low-fat or nonfat cheeses, including ricotta, string, and mozzarella. Skim or 1% milk that is liquid, powdered, or evaporated. Buttermilk that is made with low-fat milk. Nonfat or low-fat yogurt. Beverages Mineral water. Diet carbonated beverages. Sweets and Desserts Sherbets and fruit ices. Honey, jam, marmalade, jelly, and syrups. Meringues and gelatins. Pure sugar candy, such as hard candy, jelly beans, gumdrops, mints, marshmallows, and small amounts of dark chocolate. [employer] Eat all sweets and desserts in moderation. Fats and Oils Nonhydrogenated (trans-free) margarines. Vegetable oils, including soybean, sesame, sunflower, olive, peanut, safflower, corn, canola, and cottonseed. Salad dressings or mayonnaise that are made with a vegetable oil. Limit added fats and oils that you use for cooking, baking, salads, and as spreads. Other Cocoa powder. Coffee and tea. All seasonings and condiments. The items listed above may not be a complete list of recommended foods or beverages. Contact your dietitian for more options. What foods are not recommended? Grains Breads that are made with saturated or trans fats, oils, or whole milk. Croissants. Butter rolls. Cheese breads. Sweet rolls. Donuts. Buttered popcorn. Chow mein noodles. High-fat crackers, such as cheese or butter crackers. Meats and Other Protein Sources Fatty meats, such as hotdogs,  short ribs, sausage, spareribs, bacon, ribeye roast or steak, and mutton. High-fat deli meats, such as salami and bologna. Caviar. Domestic duck and goose. Organ meats, such as kidney, liver, sweetbreads, brains, gizzard, chitterlings, and heart. Dairy Cream, sour cream, cream cheese, and creamed cottage cheese. Whole milk cheeses, including blue (bleu), Monte[place] Jack, [place], [place], American, [place], [place], [place], [place], and [place]. Whole or 2% milk that is liquid, evaporated, or condensed. Whole buttermilk. Cream sauce or high-fat cheese sauce. Yogurt that is made from whole milk. Beverages Regular sodas and drinks with added sugar. Sweets and Desserts Frosting. Pudding. Cookies. Cakes other than angel food cake. Candy that has milk chocolate or white chocolate, hydrogenated fat, butter, coconut, or unknown ingredients. Buttered syrups. Full-fat ice cream or ice cream drinks. Fats and Oils Gravy that has suet, meat fat, or shortening. Cocoa butter, hydrogenated oils, palm oil, coconut oil, palm kernel oil. These can often be found in baked products, candy, fried foods, nondairy creamers, and whipped toppings. Solid fats and shortenings, including bacon fat, salt pork, lard, and butter. Nondairy cream substitutes, such as coffee creamers and sour cream substitutes. Salad dressings that are made of unknown oils, cheese, or sour cream. The items listed above may not be a complete list of foods and beverages to avoid. Contact your dietitian for more information. This information is not intended to replace advice given to you by your health care provider. Make sure you discuss any questions you have with your health care  provider. Document Released: 06/07/2008 Document Revised: 03/18/2016 Document Reviewed: 02/20/2014 Elsevier Interactive Patient Education  2017 [place] A heart murmur is an extra sound that is caused by chaotic blood flow. The murmur can be heard as a "hum"  or "whoosh" sound when blood flows through the heart. The heart has four areas called chambers. Valves separate the upper and lower chambers from each other (tricuspid valve and mitral valve) and separate the lower chambers of the heart from pathways that lead away from the heart (aortic valve and pulmonary valve). Normally, the valves open to let blood flow through or out of your heart, and then they shut to keep the blood from flowing backward. There are two types of heart murmurs:  Innocent murmurs. Most people with this type of heart murmur do not have a heart problem. Many children have innocent heart murmurs. Your health care provider may suggest some basic testing to find out whether your murmur is an innocent murmur. If an innocent heart murmur is found, there is no need for further tests or treatment and no need to restrict activities or stop playing sports.  Abnormal murmurs. These types of murmurs can occur in children and adults. Abnormal murmurs may be a sign of a more serious heart condition, such as a heart defect present at birth (congenital defect) or heart valve disease.  What are the causes? This condition is caused by heart valves that are not working properly. In children, abnormal heart murmurs are typically caused by congenital defects. In adults, abnormal murmurs are usually from heart valve problems caused by disease, infection, or aging. Three types of heart valve defects can cause a murmur:  Regurgitation. This is when blood leaks back through the valve in the wrong direction.  Mitral valve prolapse. This is when the mitral valve of the heart has a loose flap and does not close tightly.  Stenosis. This is when a valve does not open enough and blocks blood flow.  This condition may also be caused by:  Pregnancy.  Fever.  Overactive thyroid gland.  Anemia.  Exercise.  Rapid growth spurts (in children).  What are the signs or symptoms? Innocent murmurs do not  cause symptoms, and many people with abnormal murmurs may or may not have symptoms. If symptoms do develop, they may include:  Shortness of breath.  Blue coloring of the skin, especially on the fingertips.  Chest pain.  Palpitations, or feeling a fluttering or skipped heartbeat.  Fainting.  Persistent cough.  Getting tired much faster than expected.  Swelling in the abdomen, feet, or ankles.  How is this diagnosed? This condition may be diagnosed during a routine physical or other exam. If your health care provider hears a murmur with a stethoscope, he or she will listen for:  Where the murmur is located in your heart.  How long the murmur lasts (duration).  When the murmur is heard during the heartbeat.  How loud the murmur is. This may help the health care provider figure out what is causing the murmur.  You may be referred to a heart specialist (cardiologist). You may also have other tests, including:  Electrocardiogram (ECG or EKG). This test measures the electrical activity of your heart.  Echocardiogram. This test uses high frequency sound waves to make pictures of your heart.  MRI or chest X-ray.  Cardiac catheterization. This test looks at blood flow through the heart.  For children and adults who have an abnormal heart murmur  and want to stay active, it is important to complete testing, review test results, and receive recommendations from your health care provider. If heart disease is present, it may not be safe to play or be active. How is this treated? Heart murmurs themselves do not need treatment. In some cases, a heart murmur may go away on its own. If an underlying problem or disease is causing the murmur, you may need treatment. If treatment is needed, it will depend on the type and severity of the disease or heart problem causing the murmur. Treatment may include:  Medicine.  Surgery.  Dietary and lifestyle changes.  Follow these instructions at  home:  Talk with your health care provider before participating in sports or other activities that require a lot of effort and energy (are strenuous).  Learn as much as possible about your condition and any related diseases. Ask your health care provider if you may at risk for any medical emergencies.  Talk with your health care provider about what symptoms you should look out for.  It is up to you to get your test results. Ask your health care provider, or the department that is doing the test, when your results will be ready.  Keep all follow-up visits as told by your health care provider. This is important. Contact a health care provider if:  You feel light-headed.  You are frequently short of breath.  You feel more tired than usual.  You are having a hard time keeping up with normal activities or fitness routines.  You have swelling in your ankles or feet.  You have chest pain.  You notice that your heart often beats irregularly.  You develop any new symptoms. Get help right away if:  You develop severe chest pain.  You are having trouble breathing.  You have fainting spells.  Your symptoms suddenly get worse. These symptoms may represent a serious problem that is an emergency. Do not wait to see if the symptoms will go away. Get medical help right away. Call your local emergency services (911 in the [place] Do not drive yourself to the hospital. Summary  Normally, the heart valves open to let blood flow through or out of your heart, and then they shut to keep the blood from flowing backward.  Heart murmur is caused by heart valves that are not working properly.  You may need treatment if an underlying problem or disease is causing the heart murmur. Treatment may include medicine, surgery, or dietary and lifestyle changes.  Talk with your health care provider before participating in sports or other activities that require a lot of effort and energy (are  strenuous).  Talk with your health care provider about what symptoms you should watch out for. This information is not intended to replace advice given to you by your health care provider. Make sure you discuss any questions you have with your health care provider. Document Released: 10/06/2004 Document Revised: 08/17/2016 Document Reviewed: 08/17/2016 Elsevier Interactive Patient Education  2017 [employer]   Bradycardia, Adult Bradycardia is a slower-than-normal heartbeat. A normal resting heart rate for an adult ranges from 60 to 100 beats per minute. With bradycardia, the resting heart rate is less than 60 beats per minute. Bradycardia can prevent enough oxygen from reaching certain areas of your body when you are active. It can be serious if it keeps enough oxygen from reaching your brain and other parts of your body. Bradycardia is not a problem for everyone. For some healthy  adults, a slow resting heart rate is normal. What are the causes? This condition may be caused by:  A problem with the heart, including: ? A problem with the heart's electrical system, such as a heart block. ? A problem with the heart's natural pacemaker (sinus node). ? Heart disease. ? A heart attack. ? Heart damage. ? A heart infection. ? A heart condition that is present at birth (congenital heart defect).  Certain medicines that treat heart conditions.  Certain conditions, such as hypothyroidism and obstructive sleep apnea.  Problems with the balance of chemicals and other substances, like potassium, in the blood.  What increases the risk? This condition is more likely to develop in adults who:  Are age 68 or older.  Have high blood pressure (hypertension), high cholesterol (hyperlipidemia), or diabetes.  Drink heavily, use tobacco or nicotine products, or use drugs.  Are stressed.  What are the signs or symptoms? Symptoms of this condition include:  Light-headedness.  Feeling faint or  fainting.  Fatigue and weakness.  Shortness of breath.  Chest pain (angina).  Drowsiness.  Confusion.  Dizziness.  How is this diagnosed? This condition may be diagnosed based on:  Your symptoms.  Your medical history.  A physical exam.  During the exam, your health care provider will listen to your heartbeat and check your pulse. To confirm the diagnosis, your health care provider may order tests, such as:  Blood tests.  An electrocardiogram (ECG). This test records the heart's electrical activity. The test can show how fast your heart is beating and whether the heartbeat is steady.  A test in which you wear a portable device (event recorder or Holter monitor) to record your heart's electrical activity while you go about your day.  Anexercise test.  How is this treated? Treatment for this condition depends on the cause of the condition and how severe your symptoms are. Treatment may involve:  Treatment of the underlying condition.  Changing your medicines or how much medicine you take.  Having a small, battery-operated device called a pacemaker implanted under the skin. When bradycardia occurs, this device can be used to increase your heart rate and help your heart to beat in a regular rhythm.  Follow these instructions at home: Lifestyle   Manage any health conditions that contribute to bradycardia as told by your health care provider.  Follow a heart-healthy diet. A nutrition specialist (dietitian) can help to educate you about healthy food options and changes.  Follow an exercise program that is approved by your health care provider.  Maintain a healthy weight.  Try to reduce or manage your stress, such as with yoga or meditation. If you need help reducing stress, ask your health care provider.  Do not use use any products that contain nicotine or tobacco, such as cigarettes and e-cigarettes. If you need help quitting, ask your health care provider.  Do  not use illegal drugs.  Limit alcohol intake to no more than 1 drink per day for nonpregnant women and 2 drinks per day for men. One drink equals 12 oz of beer, 5 oz of wine, or 1 oz of hard liquor. General instructions  Take over-the-counter and prescription medicines only as told by your health care provider.  Keep all follow-up visits as directed by your health care provider. This is important. How is this prevented? In some cases, bradycardia may be prevented by:  Treating underlying medical problems.  Stopping behaviors or medicines that can trigger the condition.  Contact  a health care provider if:  You feel light-headed or dizzy.  You almost faint.  You feel weak or are easily fatigued during physical activity.  You experience confusion or have memory problems. Get help right away if:  You faint.  You have an irregular heartbeat (palpitations).  You have chest pain.  You have trouble breathing. This information is not intended to replace advice given to you by your health care provider. Make sure you discuss any questions you have with your health care provider. Document Released: 05/21/2002 Document Revised: 04/26/2016 Document Reviewed: 02/18/2016 Elsevier Interactive Patient Education  2017 [place] all medications as directed. We will call when you lab results are available. EKG-sinus bradycardia with sinus arrhythmia- which means that you have a slow heart rate with some irregularities in timing of the heart beats.   Try to reduce the amount of chewing tobacco. Continue heart healthy diet and running around with your grandson. Please call clinic with any chest pain, palpitations, dizziness, or excessive fatigue. Please follow-up in 6 months, sooner if needed. GREAT TO SEE YOU!

## 2017-05-18 NOTE — Assessment & Plan Note (Signed)
TSH level checked today. Currently taking Levothyroxine 75 mcg daily.

## 2017-05-18 NOTE — Progress Notes (Signed)
Subjective:    Patient ID: Carlos [name], male    DOB: [birth year], 66 y.o.   MRN: [id number]  HPI:  [name] is here for CPE.   PMH: Hypothyroidism and HL. He has sig family hx of prostate ca-father and brother.  He denies urinary sx's, however would like to have PSA level checked with labs today.  He eats "on ok diet" and "runs around with me grandson", overall he feels like his health is good. He is compliant on all medications and denies SE. He reports becoming a "little winded" when he is "rough-housing or running after" his 49 year old grandson [name]. He denies CP/dyspnea at rest/palpitations/dizziness. Healthcare maintenance: Colonoscopy-completed March 2016, instructed to repeat in 10 years. No smoking hx, therefore AAA screening not required.   Patient Care Team    Relationship Specialty Notifications Start End  Mina Marble D, NP PCP - General Family Medicine  12/15/16   Gatha Mayer, MD Consulting Physician Gastroenterology  12/15/16     Patient Active Problem List   Diagnosis Date Noted  . Heart murmur 05/18/2017  . Health care maintenance 12/15/2016  . Family history of prostate cancer 04/21/2016  . Nevus of neck 04/21/2016  . Colon cancer screening 09/23/2014  . HLD (hyperlipidemia) 02/18/2014  . Hypothyroidism 02/18/2014  . BMI 25.0-25.9,adult 02/18/2014  . Tobacco abuse counseling 02/18/2014     Past Medical History:  Diagnosis Date  . HLD (hyperlipidemia)   . Hypothyroidism   . Irregular heartbeat    self-reported "slow heartbeat"     Past Surgical History:  Procedure Laterality Date  . NO PAST SURGERIES     as of 02/2014     Family History  Problem Relation Age of Onset  . Prostate cancer Father        Spread to bones  . Alcoholism Other        Mother and Father  . COPD Brother        Emphysema  . Diabetes Brother        neither required medicine  . Heart disease Other        Grandparents  . Early death Other        Mother, 62  (PNA); Father, 33 (cancer)  . Hypertension Brother   . Hyperlipidemia Brother   . Thyroid disease Other        Brother and grandfather  . Colon cancer Neg Hx      History  Drug Use No     History  Alcohol Use No     History  Smoking Status  . Never Smoker  Smokeless Tobacco  . Current User  . Types: Chew     Outpatient Encounter Prescriptions as of 05/18/2017  Medication Sig  . aspirin 81 MG tablet Take 81 mg by mouth daily.  Marland Kitchen levothyroxine (SYNTHROID, LEVOTHROID) 75 MCG tablet Take 1 tablet (75 mcg total) by mouth daily.  Marland Kitchen lovastatin (MEVACOR) 20 MG tablet Take 1 tablet (20 mg total) by mouth at bedtime.  Marland Kitchen OVER THE COUNTER MEDICATION Take 200 mcg by mouth daily. SELENIUM SUPPLEMENT - OTC DAILY  . OVER THE COUNTER MEDICATION Take 2.5 g by mouth daily. SPIRULINA - OTC DAILY  . OVER THE COUNTER MEDICATION Take 2 g by mouth daily. BARLEY GRASS - OTC DAILY  . OVER THE COUNTER MEDICATION Turmeric 450mg  2-3 times per week  . Probiotic Product (SOLUBLE FIBER/PROBIOTICS PO) Take 1 capsule by mouth 1 day or 1 dose.  . vitamin  E 200 UNIT capsule Take 200 Units by mouth daily.   No facility-administered encounter medications on file as of 05/18/2017.     Allergies: Patient has no known allergies.  Body mass index is 24.71 kg/m.  Blood pressure 113/70, pulse 71, height 5\' 11"  (1.803 m), weight 177 lb 3.2 oz (80.4 kg).      Review of Systems  Constitutional: Negative for activity change, appetite change, chills, diaphoresis, fever and unexpected weight change.  HENT: Negative for congestion.   Eyes: Negative for visual disturbance.  Respiratory: Negative for cough, chest tightness, shortness of breath, wheezing and stridor.   Cardiovascular: Negative for chest pain, palpitations and leg swelling.  Gastrointestinal: Negative for abdominal distention, abdominal pain, blood in stool, constipation, diarrhea, nausea and vomiting.  Endocrine: Negative for cold intolerance,  heat intolerance, polydipsia, polyphagia and polyuria.  Genitourinary: Negative for difficulty urinating, flank pain and hematuria.  Musculoskeletal: Negative for arthralgias, back pain, gait problem, joint swelling, myalgias, neck pain and neck stiffness.  Skin: Negative for color change, pallor, rash and wound.  Neurological: Negative for dizziness, tremors, weakness and headaches.  Hematological: Does not bruise/bleed easily.  Psychiatric/Behavioral: Negative for dysphoric mood, self-injury, sleep disturbance and suicidal ideas.       Objective:   Physical Exam  Constitutional: He is oriented to person, place, and time. He appears well-developed and well-nourished. No distress.  HENT:  Head: Normocephalic and atraumatic.  Right Ear: Hearing, tympanic membrane, external ear and ear canal normal. Tympanic membrane is not erythematous and not bulging. No decreased hearing is noted.  Left Ear: Hearing, tympanic membrane, external ear and ear canal normal. Tympanic membrane is not erythematous and not bulging. No decreased hearing is noted.  Nose: Nose normal. Right sinus exhibits no maxillary sinus tenderness and no frontal sinus tenderness. Left sinus exhibits no maxillary sinus tenderness and no frontal sinus tenderness.  Mouth/Throat: Uvula is midline, oropharynx is clear and moist and mucous membranes are normal. Abnormal dentition.  No upper teeth  Eyes: Pupils are equal, round, and reactive to light. Conjunctivae are normal.  Neck: Normal range of motion. Neck supple.  Cardiovascular: Normal rate and intact distal pulses.  An irregular rhythm present.  Murmur heard. Pulmonary/Chest: Effort normal and breath sounds normal. No respiratory distress. He has no wheezes. He has no rales. He exhibits no tenderness.  Abdominal: Soft. Bowel sounds are normal. He exhibits no distension and no mass. There is no tenderness. There is no rebound and no guarding.  Genitourinary:  Genitourinary  Comments: Vehemently declined DRE  Musculoskeletal: Normal range of motion.  Lymphadenopathy:    He has no cervical adenopathy.  Neurological: He is alert and oriented to person, place, and time. He displays normal reflexes. No cranial nerve deficit. He exhibits normal muscle tone. Coordination normal.  Skin: Skin is warm and dry. No rash noted. He is not diaphoretic. No erythema. No pallor.  Psychiatric: He has a normal mood and affect. His behavior is normal. Judgment and thought content normal.  Nursing note and vitals reviewed.         Assessment & Plan:   1. Other specified hypothyroidism   2. Family history of prostate cancer   3. Health care maintenance   4. Other hyperlipidemia   5. Hypothyroidism, unspecified type   6. Tobacco abuse counseling   7. Colon cancer screening   8. Heart murmur     Hypothyroidism TSH level checked today. Currently taking Levothyroxine 75 mcg daily.   HLD (hyperlipidemia) Fastin lipid  panel drawn today. Continue heart healthy diet and lovastatin 20mg  daily.  Tobacco abuse counseling Denies smoking tobacco, however daily use of chewing tobacco.  Declined tobacco cessation today.  Colon cancer screening Declined DRE with FOBT screening at today's OV. He had screening colonoscopy completed 11/2014, instructed to repeat in 10 years. Follow heart healthy diet.   Family history of prostate cancer Denies current urinary sx's, PSA level drawn today. Denies sexual intercourse or riding a motorcycle in last 72 hrs prior to PSA drawn this morning.   Heart murmur Reports hx of murmur detected while in [employer]. Murmur not appreciated at initial OV in 12/2016, nor documented in previous PEs with other providers. EKG completed today- SB with SA Results discussed with pt.     FOLLOW-UP:  Return in about 6 months (around 11/15/2017) for CPE, Fasting Lab Draw.

## 2017-05-18 NOTE — Addendum Note (Signed)
Addended by: Fonnie Mu on: 05/18/2017 02:55 PM   Modules accepted: Orders

## 2017-05-19 LAB — CBC WITH DIFFERENTIAL/PLATELET
Basophils Absolute: 0.1 10*3/uL (ref 0.0–0.2)
Basos: 1 %
EOS (ABSOLUTE): 0.2 10*3/uL (ref 0.0–0.4)
EOS: 4 %
HEMATOCRIT: 48.2 % (ref 37.5–51.0)
HEMOGLOBIN: 16.2 g/dL (ref 13.0–17.7)
IMMATURE GRANULOCYTES: 0 %
Immature Grans (Abs): 0 10*3/uL (ref 0.0–0.1)
LYMPHS ABS: 1.4 10*3/uL (ref 0.7–3.1)
Lymphs: 27 %
MCH: 30.1 pg (ref 26.6–33.0)
MCHC: 33.6 g/dL (ref 31.5–35.7)
MCV: 90 fL (ref 79–97)
Monocytes Absolute: 0.7 10*3/uL (ref 0.1–0.9)
Monocytes: 12 %
NEUTROS PCT: 56 %
Neutrophils Absolute: 3 10*3/uL (ref 1.4–7.0)
Platelets: 189 10*3/uL (ref 150–379)
RBC: 5.38 x10E6/uL (ref 4.14–5.80)
RDW: 13.7 % (ref 12.3–15.4)
WBC: 5.4 10*3/uL (ref 3.4–10.8)

## 2017-05-19 LAB — COMPREHENSIVE METABOLIC PANEL
ALBUMIN: 4.6 g/dL (ref 3.6–4.8)
ALT: 13 IU/L (ref 0–44)
AST: 19 IU/L (ref 0–40)
Albumin/Globulin Ratio: 2 (ref 1.2–2.2)
Alkaline Phosphatase: 78 IU/L (ref 39–117)
BUN / CREAT RATIO: 15 (ref 10–24)
BUN: 15 mg/dL (ref 8–27)
Bilirubin Total: 1 mg/dL (ref 0.0–1.2)
CALCIUM: 9.2 mg/dL (ref 8.6–10.2)
CO2: 22 mmol/L (ref 20–29)
CREATININE: 1.03 mg/dL (ref 0.76–1.27)
Chloride: 106 mmol/L (ref 96–106)
GFR calc Af Amer: 86 mL/min/{1.73_m2} (ref >59)
GFR, EST NON AFRICAN AMERICAN: 74 mL/min/{1.73_m2} (ref >59)
GLOBULIN, TOTAL: 2.3 g/dL (ref 1.5–4.5)
Glucose: 101 mg/dL — ABNORMAL HIGH (ref 65–99)
Potassium: 4.2 mmol/L (ref 3.5–5.2)
SODIUM: 143 mmol/L (ref 134–144)
Total Protein: 6.9 g/dL (ref 6.0–8.5)

## 2017-05-19 LAB — HEMOGLOBIN A1C
ESTIMATED AVERAGE GLUCOSE: 114 mg/dL
Hgb A1c MFr Bld: 5.6 % (ref 4.8–5.6)

## 2017-05-19 LAB — LIPID PANEL
CHOL/HDL RATIO: 3.4 ratio (ref 0.0–5.0)
Cholesterol, Total: 170 mg/dL (ref 100–199)
HDL: 50 mg/dL (ref >39)
LDL Calculated: 94 mg/dL (ref 0–99)
Triglycerides: 129 mg/dL (ref 0–149)
VLDL CHOLESTEROL CAL: 26 mg/dL (ref 5–40)

## 2017-05-19 LAB — PSA: PROSTATE SPECIFIC AG, SERUM: 0.9 ng/mL (ref 0.0–4.0)

## 2017-05-19 LAB — TSH: TSH: 5.47 u[IU]/mL — ABNORMAL HIGH (ref 0.450–4.500)

## 2017-05-19 LAB — VITAMIN D 25 HYDROXY (VIT D DEFICIENCY, FRACTURES): VIT D 25 HYDROXY: 36.6 ng/mL (ref 30.0–100.0)

## 2017-05-22 ENCOUNTER — Other Ambulatory Visit: Payer: Self-pay | Admitting: Adult Health

## 2017-05-22 DIAGNOSIS — E785 Hyperlipidemia, unspecified: Secondary | ICD-10-CM

## 2017-05-22 DIAGNOSIS — E038 Other specified hypothyroidism: Secondary | ICD-10-CM

## 2017-05-22 MED ORDER — LOVASTATIN 40 MG PO TABS: 40.0000 mg | ORAL_TABLET | Freq: Every day | ORAL | 2 refills | Status: DC

## 2017-05-22 MED ORDER — LEVOTHYROXINE SODIUM 100 MCG PO TABS: 100.0000 ug | ORAL_TABLET | Freq: Every day | ORAL | 0 refills | Status: DC

## 2017-06-01 ENCOUNTER — Other Ambulatory Visit: Payer: Self-pay

## 2017-06-01 ENCOUNTER — Telehealth: Payer: Self-pay | Admitting: Adult Health

## 2017-06-01 DIAGNOSIS — E785 Hyperlipidemia, unspecified: Secondary | ICD-10-CM

## 2017-06-01 MED ORDER — LOVASTATIN 40 MG PO TABS: 40.0000 mg | ORAL_TABLET | Freq: Every day | ORAL | 2 refills | Status: DC

## 2017-06-01 MED ORDER — LEVOTHYROXINE SODIUM 100 MCG PO TABS: 100.0000 ug | ORAL_TABLET | Freq: Every day | ORAL | 0 refills | Status: DC

## 2017-06-01 NOTE — Telephone Encounter (Signed)
Patient needs his meds resent to [facility] that were ordered on 05/22/17, not [facility]

## 2017-06-01 NOTE — Addendum Note (Signed)
Addended by: Fonnie Mu on: 06/01/2017 04:39 PM   Modules accepted: Orders

## 2017-06-21 DIAGNOSIS — Z833 Family history of diabetes mellitus: Secondary | ICD-10-CM | POA: Insufficient documentation

## 2017-07-03 ENCOUNTER — Other Ambulatory Visit: Payer: Medicare HMO

## 2017-07-17 ENCOUNTER — Telehealth: Payer: Self-pay

## 2017-07-17 ENCOUNTER — Other Ambulatory Visit (INDEPENDENT_AMBULATORY_CARE_PROVIDER_SITE_OTHER): Payer: Medicare HMO

## 2017-07-17 DIAGNOSIS — E785 Hyperlipidemia, unspecified: Secondary | ICD-10-CM

## 2017-07-17 DIAGNOSIS — E038 Other specified hypothyroidism: Secondary | ICD-10-CM | POA: Diagnosis not present

## 2017-07-17 NOTE — Telephone Encounter (Signed)
Patient came into the office today for lab work.  Patient states that he is having numbness/tingling in both of his arms that is worse in the mornings and would like to know if it could be due to medication increase since his last office visit. Reviewed the chart and Remi Haggard, NP had increased losartan from 20 mg to 40 mg daily and levothyroxine from 75 mcg to 100 mcg.  I spoke to Dr Raliegh Scarlet and she advised me to inform the patient that it may be caused by the levothyroxine or a change in his thyroid levels and that we would be able to tell him more once were haves his thyroid levels back from the lab (24 to 48 hours).  Patient notified and expressed understanding.  Free T3 and free T4 levels were added to lab work (I called [facility] to add these 2 test).  Patient also requested diet information for pre diabeties, which I provided him with and told him to call the office with any questions or concerns. MPulliam, CMA/RT(R)

## 2017-07-18 LAB — TSH: TSH: 0.747 u[IU]/mL (ref 0.450–4.500)

## 2017-07-18 LAB — HEPATIC FUNCTION PANEL
ALBUMIN: 4.3 g/dL (ref 3.6–4.8)
ALK PHOS: 83 IU/L (ref 39–117)
ALT: 17 IU/L (ref 0–44)
AST: 16 IU/L (ref 0–40)
BILIRUBIN TOTAL: 0.6 mg/dL (ref 0.0–1.2)
Bilirubin, Direct: 0.16 mg/dL (ref 0.00–0.40)
Total Protein: 6.4 g/dL (ref 6.0–8.5)

## 2017-07-18 LAB — LIPID PANEL
CHOL/HDL RATIO: 2.8 ratio (ref 0.0–5.0)
CHOLESTEROL TOTAL: 121 mg/dL (ref 100–199)
HDL: 44 mg/dL (ref >39)
LDL CALC: 58 mg/dL (ref 0–99)
TRIGLYCERIDES: 94 mg/dL (ref 0–149)
VLDL Cholesterol Cal: 19 mg/dL (ref 5–40)

## 2017-07-19 ENCOUNTER — Other Ambulatory Visit: Payer: Self-pay | Admitting: Adult Health

## 2017-07-19 DIAGNOSIS — E039 Hypothyroidism, unspecified: Secondary | ICD-10-CM

## 2017-07-19 MED ORDER — LEVOTHYROXINE SODIUM 88 MCG PO TABS: 88.0000 ug | ORAL_TABLET | Freq: Every day | ORAL | 0 refills | Status: DC

## 2017-07-20 LAB — T4, FREE: FREE T4: 1.92 ng/dL — AB (ref 0.82–1.77)

## 2017-07-20 LAB — T3, FREE: T3, Free: 3.2 pg/mL (ref 2.0–4.4)

## 2017-07-20 LAB — SPECIMEN STATUS REPORT

## 2017-07-28 ENCOUNTER — Telehealth: Payer: Self-pay | Admitting: Adult Health

## 2017-07-28 MED ORDER — LEVOTHYROXINE SODIUM 88 MCG PO TABS: 88.0000 ug | ORAL_TABLET | Freq: Every day | ORAL | 0 refills | Status: DC

## 2017-07-28 NOTE — Telephone Encounter (Signed)
Per conversation with Annabell Sabal, who took this telephone message, pt is requesting that levothyroxine RX be sent to [facility] rather than [facility].  RX at [facility] cancelled and resent to [facility].  Charyl Bigger, CMA

## 2017-07-28 NOTE — Telephone Encounter (Signed)
Pt called states Rx for levothyroxine 88 MCG sent to [facility] mail order not [facility]-- Pt states he did not  Know to pick up from [facility] and never went to get. --glh

## 2017-07-28 NOTE — Telephone Encounter (Signed)
Attempted to call back but received notification that pt is not accepting calls at this time.  Will attempt again.  Charyl Bigger, CMA

## 2017-07-31 ENCOUNTER — Telehealth: Payer: Self-pay | Admitting: Adult Health

## 2017-07-31 NOTE — Telephone Encounter (Signed)
Pt informed that lovastatin RX was sent to [facility] on 06/01/17  #90 with 2 refills.  Advised pt to call pharmacy to request they refill his medication.  Pt expressed understanding and is agreeable.  Charyl Bigger, CMA

## 2017-07-31 NOTE — Telephone Encounter (Signed)
Patient called requested Rx refill on lovastatin 40 MG tablets--takes 1 daily--pls send to [facility] mail order.  --glh

## 2017-08-24 ENCOUNTER — Other Ambulatory Visit: Payer: Medicare HMO

## 2017-09-14 ENCOUNTER — Ambulatory Visit (INDEPENDENT_AMBULATORY_CARE_PROVIDER_SITE_OTHER): Payer: Medicare HMO | Admitting: Adult Health

## 2017-09-14 ENCOUNTER — Other Ambulatory Visit: Payer: Medicare HMO

## 2017-09-14 ENCOUNTER — Encounter: Payer: Self-pay | Admitting: Adult Health

## 2017-09-14 VITALS — BP 129/89 | HR 67 | Ht 71.0 in | Wt 169.8 lb

## 2017-09-14 DIAGNOSIS — E039 Hypothyroidism, unspecified: Secondary | ICD-10-CM | POA: Diagnosis not present

## 2017-09-14 DIAGNOSIS — R200 Anesthesia of skin: Secondary | ICD-10-CM | POA: Insufficient documentation

## 2017-09-14 DIAGNOSIS — E785 Hyperlipidemia, unspecified: Secondary | ICD-10-CM | POA: Diagnosis not present

## 2017-09-14 DIAGNOSIS — R202 Paresthesia of skin: Secondary | ICD-10-CM | POA: Diagnosis not present

## 2017-09-14 MED ORDER — LOVASTATIN 20 MG PO TABS: 20.0000 mg | ORAL_TABLET | Freq: Every day | ORAL | 3 refills | Status: DC

## 2017-09-14 NOTE — Progress Notes (Signed)
Subjective:    Patient ID: Carlos [name], male    DOB: 27-Feb-1951, 67 y.o.   MRN: [id number]  HPI :  [name] presents with R trapezius and bil hand numbness/tingling that developed end of Sept 2018 (about two weeks after Lovastatin was increased from 20mg  to 40mg ). He denies hx of cervical neck injury prior to onset of sx's.  He is not diabetic (last A1c 05/18/17- 5.6). He denies CP/dyspnea/palpitations/dizziness/sweating. He drinks one soda a day and several Propel/fitness waters a day. He has been trying to reduce CHO/sugar intake, however continues to eat candy daily. He had all his teeth extracted 2016 and has dentures, however he has been seen by dentist since then and has been chewing tobacco >45 years. Will run thyroid panel-currently on Levothyroxine 53mcg/day and feels that fatigue is much improved.  Patient Care Team    Relationship Specialty Notifications Start End  Mina Marble D, NP PCP - General Family Medicine  12/15/16   Gatha Mayer, MD Consulting Physician Gastroenterology  12/15/16     Patient Active Problem List   Diagnosis Date Noted  . Family history of diabetes mellitus (DM) 06/21/2017  . Hyperlipidemia 05/22/2017  . Heart murmur 05/18/2017  . Health care maintenance 12/15/2016  . Family history of prostate cancer 04/21/2016  . Nevus of neck 04/21/2016  . Colon cancer screening 09/23/2014  . HLD (hyperlipidemia) 02/18/2014  . Hypothyroidism 02/18/2014  . BMI 25.0-25.9,adult 02/18/2014  . Tobacco abuse counseling 02/18/2014     Past Medical History:  Diagnosis Date  . HLD (hyperlipidemia)   . Hypothyroidism   . Irregular heartbeat    self-reported "slow heartbeat"     Past Surgical History:  Procedure Laterality Date  . NO PAST SURGERIES     as of 02/2014     Family History  Problem Relation Age of Onset  . Prostate cancer Father        Spread to bones  . Alcoholism Other        Mother and Father  . COPD Brother    Emphysema  . Diabetes Brother        neither required medicine  . Heart disease Other        Grandparents  . Early death Other        Mother, 84 (PNA); Father, 21 (cancer)  . Hypertension Brother   . Hyperlipidemia Brother   . Thyroid disease Other        Brother and grandfather  . Colon cancer Neg Hx      Social History   Substance and Sexual Activity  Drug Use No     Social History   Substance and Sexual Activity  Alcohol Use No  . Alcohol/week: 0.0 oz     Social History   Tobacco Use  Smoking Status Never Smoker  Smokeless Tobacco Current User  . Types: Chew     Outpatient Encounter Medications as of 09/14/2017  Medication Sig  . ALOE VERA PO Take 1 capsule by mouth daily.  Marland Kitchen aspirin 81 MG tablet Take 81 mg by mouth daily.  Marland Kitchen levothyroxine (SYNTHROID, LEVOTHROID) 88 MCG tablet Take 1 tablet (88 mcg total) daily by mouth.  . lovastatin (MEVACOR) 40 MG tablet Take 1 tablet (40 mg total) by mouth at bedtime.  . Multiple Vitamins-Minerals (MULTIVITAMIN GUMMIES ADULT PO) Take 1 each by mouth daily.  Marland Kitchen OVER THE COUNTER MEDICATION Take 200 mcg by mouth daily. SELENIUM SUPPLEMENT - OTC DAILY  . OVER THE COUNTER  MEDICATION Take 2.5 g by mouth daily. SPIRULINA - OTC DAILY  . OVER THE COUNTER MEDICATION Take 2 g by mouth daily. BARLEY GRASS - OTC DAILY  . OVER THE COUNTER MEDICATION Turmeric 450mg  2-3 times per week  . Probiotic Product (SOLUBLE FIBER/PROBIOTICS PO) Take 1 capsule by mouth 1 day or 1 dose.  . vitamin E 200 UNIT capsule Take 200 Units by mouth daily.   No facility-administered encounter medications on file as of 09/14/2017.     Allergies: Patient has no known allergies.  Body mass index is 23.68 kg/m.  Blood pressure 129/89, pulse 67, height 5\' 11"  (1.803 m), weight 169 lb 12.8 oz (77 kg), SpO2 98 %.   Of Note Review of Systems  Constitutional: Negative for activity change, appetite change, chills, diaphoresis, fatigue, fever and unexpected  weight change.  Eyes: Negative for visual disturbance.  Respiratory: Negative for cough, chest tightness, shortness of breath, wheezing and stridor.   Cardiovascular: Negative for chest pain and leg swelling.  Musculoskeletal: Positive for neck stiffness. Negative for myalgias.  Neurological: Positive for numbness. Negative for dizziness, tremors, weakness and headaches.  Hematological: Does not bruise/bleed easily.       Objective:   Physical Exam  Constitutional: He is oriented to person, place, and time. He appears well-developed and well-nourished. No distress.  Cardiovascular: Normal rate, regular rhythm and intact distal pulses.  Murmur heard. Pulmonary/Chest: Effort normal and breath sounds normal. No respiratory distress. He has no wheezes. He has no rales. He exhibits no tenderness.  Neurological: He is alert and oriented to person, place, and time. He has normal reflexes. He displays normal reflexes. No cranial nerve deficit. He exhibits normal muscle tone. Coordination normal.  Skin: Skin is warm and dry. No rash noted. He is not diaphoretic. No erythema. No pallor.  Psychiatric: He has a normal mood and affect. His behavior is normal. Judgment and thought content normal.  Nursing note and vitals reviewed.         Assessment & Plan:   1. Numbness and tingling in both hands   2. Hypothyroidism, unspecified type   3. Hyperlipidemia, unspecified hyperlipidemia type     Numbness and tingling in both hands Please stop Lovastatin 40mg  daily and start Lovastatin 20mg  once daily. Please call clinic in 3-4 weeks and let me know if the numbness/tingling has stopped- if not will consider MRI.  Hyperlipidemia Please stop Lovastatin 40mg  daily and start Lovastatin 20mg  once daily. Continue your excellent level of exercise/movement. Increase plain water intake. Please call clinic in 3-4 weeks and let me know if the numbness/tingling has stopped- if not will consider MRI. Will  re-check lipid panel Sept 2019  Hypothyroidism We will call you when your thyroid panel results are available. Currently taking Levothyroxine 88 mcg/daily     FOLLOW-UP:  Return if symptoms worsen or fail to improve.

## 2017-09-14 NOTE — Assessment & Plan Note (Addendum)
Please stop Lovastatin 40mg  daily and start Lovastatin 20mg  once daily. Denies acute cervical neck injury prior to onset of sx's. Please call clinic in 3-4 weeks and let me know if the numbness/tingling has stopped- if not will consider MRI.

## 2017-09-14 NOTE — Assessment & Plan Note (Signed)
Please stop Lovastatin 40mg  daily and start Lovastatin 20mg  once daily. Continue your excellent level of exercise/movement. Increase plain water intake. Please call clinic in 3-4 weeks and let me know if the numbness/tingling has stopped- if not will consider MRI. Will re-check lipid panel Sept 2019

## 2017-09-14 NOTE — Assessment & Plan Note (Signed)
We will call you when your thyroid panel results are available. Currently taking Levothyroxine 88 mcg/daily

## 2017-09-14 NOTE — Patient Instructions (Signed)
Lovastatin tablets What is this medicine? LOVASTATIN (LOE va sta tin) is known as a HMG-CoA reductase inhibitor or 'statin'. It lowers the level of cholesterol in the blood. This drug may also reduce the risk of heart attack or other health problems in patients with risk factors for heart disease. Diet and lifestyle changes are often used with this drug. This medicine may be used for other purposes; ask your health care provider or pharmacist if you have questions. COMMON BRAND NAME(S): Mevacor What should I tell my health care provider before I take this medicine? They need to know if you have any of these conditions: -frequently drink alcoholic beverages -kidney disease -liver disease -muscle aches or weakness -other medical condition -an unusual or allergic reaction to lovastatin, other medicines, foods, dyes, or preservatives -pregnant or trying to get pregnant -breast-feeding How should I use this medicine? Take this medicine by mouth with a glass of water. Follow the directions on the prescription label. Take with food. Take your doses at regular intervals. Do not take your medicine more often than directed. Talk to your pediatrician regarding the use of this medicine in children. While this drug may be prescribed for children as young as 58 years of age for selected conditions, precautions do apply. Overdosage: If you think you have taken too much of this medicine contact a poison control center or emergency room at once. NOTE: This medicine is only for you. Do not share this medicine with others. What if I miss a dose? If you miss a dose, take it as soon as you can. If it is almost time for your next dose, take only that dose. Do not take double or extra doses. What may interact with this medicine? Do not take this medicine with any of the following medications: -boceprevir -certain antibiotics like erythromycin, clarithromycin, and telithromycin -certain antiviral medicines for HIV  or AIDS -gemfibrozil -herbal medicines like red yeast rice -medicines for fungal infections like itraconazole, ketoconazole, and posaconazole -mifepristone, RU-486 -nefazodone -telaprevir This medicine may also interact with the following medications: -alcohol -amiodarone -colchicine -cyclosporine -danazol -diltiazem -fluconazole -grapefruit juice -other medicines for high cholesterol -ranolazine -verapamil -voriconazole -warfarin This list may not describe all possible interactions. Give your health care provider a list of all the medicines, herbs, non-prescription drugs, or dietary supplements you use. Also tell them if you smoke, drink alcohol, or use illegal drugs. Some items may interact with your medicine. What should I watch for while using this medicine? Visit your doctor or health care professional for regular check-ups. You may need regular tests to make sure your liver is working properly. Tell your doctor or health care professional right away if you get any unexplained muscle pain, tenderness, or weakness, especially if you also have a fever and tiredness. Your doctor or health care professional may tell you to stop taking this medicine if you develop muscle problems. If your muscle problems do not go away after stopping this medicine, contact your health care professional. This drug is only part of a total heart-health program. Your doctor or a dietician can suggest a low-cholesterol and low-fat diet to help. Avoid alcohol and smoking, and keep a proper exercise schedule. Do not use this drug if you are pregnant or breast-feeding. Serious side effects to an unborn child or to an infant are possible. Talk to your doctor or pharmacist for more information. This medicine may affect blood sugar levels. If you have diabetes, check with your doctor or health care professional before  you change your diet or the dose of your diabetic medicine. If you are going to have surgery tell  your health care professional that you are taking this drug. Some drugs may increase the risk of side effects from lovastatin. If you are given certain antibiotics or antifungals, your doctor or health care professional may stop lovastatin for a short time. Check with your doctor or pharmacist for advice. What side effects may I notice from receiving this medicine? Side effects that you should report to your doctor or health care professional as soon as possible: -allergic reactions like skin rash, itching or hives, swelling of the face, lips, or tongue -dark urine -fever -joint pain -muscle cramps, pain -redness, blistering, peeling or loosening of the skin, including inside the mouth -trouble passing urine or change in the amount of urine -unusually weak or tired -yellowing of the eyes or skin Side effects that usually do not require medical attention (report to your doctor or health care professional if they continue or are bothersome): -constipation -gas -heartburn -indigestion -stomach pain This list may not describe all possible side effects. Call your doctor for medical advice about side effects. You may report side effects to FDA at 1-800-FDA-1088. Where should I keep my medicine? Keep out of the reach of children. Store at room temperature between 5 and 30 degrees C (41 and 86 degrees F). Protect from light. Keep container tightly closed. Throw away any unused medicine after the expiration date. NOTE: This sheet is a summary. It may not cover all possible information. If you have questions about this medicine, talk to your doctor, pharmacist, or health care provider.  2018 Elsevier/Gold Standard (2011-07-19 10:33:10)  Please stop Lovastatin 40mg  daily and start Lovastatin 20mg  once daily. Continue your excellent level of exercise/movement. Reduce sugar/carbohydrate intake. We will call you when your thyroid panel results are available. Increase plain water intake. Recommend  at least annual follow-up with your dentist, re: long term chewing tobacco use. Please call clinic in 3-4 weeks and let me know if the numbness/tingling has stopped- if not will consider MRI. NICE TO SEE YOU!

## 2017-09-15 ENCOUNTER — Other Ambulatory Visit: Payer: Self-pay | Admitting: Adult Health

## 2017-09-15 DIAGNOSIS — E039 Hypothyroidism, unspecified: Secondary | ICD-10-CM

## 2017-09-15 LAB — T3, FREE: T3, Free: 3.1 pg/mL (ref 2.0–4.4)

## 2017-09-15 LAB — T4, FREE: Free T4: 1.69 ng/dL (ref 0.82–1.77)

## 2017-09-15 LAB — TSH: TSH: 3.43 u[IU]/mL (ref 0.450–4.500)

## 2017-09-15 MED ORDER — LEVOTHYROXINE SODIUM 88 MCG PO TABS: 88.0000 ug | ORAL_TABLET | Freq: Every day | ORAL | 1 refills | Status: DC

## 2017-09-21 DIAGNOSIS — H524 Presbyopia: Secondary | ICD-10-CM | POA: Diagnosis not present

## 2017-11-15 NOTE — Progress Notes (Signed)
Subjective:    Patient ID: Carlos [name], male    DOB: 08/08/1951, 67 y.o.   MRN: [id number]  HPI:  05/18/17 OV: Carlos [name] is here for CPE.   PMH: Hypothyroidism and HL. He has sig family hx of prostate ca-father and brother.  He denies urinary sx's, however would like to have PSA level checked with labs today.  He eats "on ok diet" and "runs around with me grandson", overall he feels like his health is good. He is compliant on all medications and denies SE. He reports becoming a "little winded" when he is "rough-housing or running after" his 31 year old grandson Carlos [name]. He denies CP/dyspnea at rest/palpitations/dizziness. Healthcare maintenance: Colonoscopy-completed March 2016, instructed to repeat in 10 years. No smoking hx, therefore AAA screening not required.   11/16/17 OV: Carlos [name] is here for regular f/u: He was seen jan 2019 for numbness/tingling, Lovastatin reduced from 40mg  to 20mg  QD. He reports 40% reduction in sx's since dosage cut in half. He reports monthly dizziness/blurred vision has been occurring > 15 years. He experienced profound visual disturbance a few weeks ago when he was driving to [facility], which required him to pull off road until vision returned to normal. He reports the most sig "occurrence" happened 15 years ago, and in addition to dizziness/vision change he could not recall phone numbers of near by friends to call for help. He denies migraine hx. He had recent eye examination with dilation Jan 2019- per pt normal CT Head WO strongly recommended due to frequency and recent severity of sx's. He denies family hx of MI/CVA    Patient Care Team    Relationship Specialty Notifications Start End  Mertzon, Valetta Fuller D, NP PCP - General Family Medicine  12/15/16   Gatha Mayer, MD Consulting Physician Gastroenterology  12/15/16     Patient Active Problem List   Diagnosis Date Noted  . Numbness and tingling in both hands 09/14/2017  . Family history of  diabetes mellitus (DM) 06/21/2017  . Hyperlipidemia 05/22/2017  . Heart murmur 05/18/2017  . Health care maintenance 12/15/2016  . Family history of prostate cancer 04/21/2016  . Nevus of neck 04/21/2016  . Colon cancer screening 09/23/2014  . HLD (hyperlipidemia) 02/18/2014  . Hypothyroidism 02/18/2014  . BMI 25.0-25.9,adult 02/18/2014  . Tobacco abuse counseling 02/18/2014     Past Medical History:  Diagnosis Date  . HLD (hyperlipidemia)   . Hypothyroidism   . Irregular heartbeat    self-reported "slow heartbeat"     Past Surgical History:  Procedure Laterality Date  . NO PAST SURGERIES     as of 02/2014     Family History  Problem Relation Age of Onset  . Prostate cancer Father        Spread to bones  . Alcoholism Other        Mother and Father  . COPD Brother        Emphysema  . Diabetes Brother        neither required medicine  . Heart disease Other        Grandparents  . Early death Other        Mother, 1 (PNA); Father, 13 (cancer)  . Hypertension Brother   . Hyperlipidemia Brother   . Thyroid disease Other        Brother and grandfather  . Colon cancer Neg Hx      Social History   Substance and Sexual Activity  Drug Use No  Social History   Substance and Sexual Activity  Alcohol Use No  . Alcohol/week: 0.0 oz     Social History   Tobacco Use  Smoking Status Never Smoker  Smokeless Tobacco Current User  . Types: Chew     Outpatient Encounter Medications as of 11/16/2017  Medication Sig  . ALOE VERA PO Take 1 capsule by mouth daily.  Marland Kitchen aspirin 81 MG tablet Take 81 mg by mouth daily.  Marland Kitchen levothyroxine (SYNTHROID, LEVOTHROID) 88 MCG tablet Take 1 tablet (88 mcg total) by mouth daily.  Marland Kitchen lovastatin (MEVACOR) 20 MG tablet Take 1 tablet (20 mg total) by mouth at bedtime.  . Multiple Vitamins-Minerals (MULTIVITAMIN GUMMIES ADULT PO) Take 1 each by mouth daily.  Marland Kitchen OVER THE COUNTER MEDICATION Take 200 mcg by mouth daily. SELENIUM  SUPPLEMENT - OTC DAILY  . OVER THE COUNTER MEDICATION Take 2.5 g by mouth daily. SPIRULINA - OTC DAILY  . OVER THE COUNTER MEDICATION Take 2 g by mouth daily. BARLEY GRASS - OTC DAILY  . OVER THE COUNTER MEDICATION Turmeric 450mg  2-3 times per week  . Probiotic Product (SOLUBLE FIBER/PROBIOTICS PO) Take 1 capsule by mouth 1 day or 1 dose.  . vitamin E 200 UNIT capsule Take 200 Units by mouth daily.   No facility-administered encounter medications on file as of 11/16/2017.     Allergies: Patient has no known allergies.  Body mass index is 23.47 kg/m.  Blood pressure 119/67, pulse (!) 53, height 5\' 11"  (1.803 m), weight 168 lb 4.8 oz (76.3 kg), SpO2 99 %.   Review of Systems  Constitutional: Negative for activity change, appetite change, chills, diaphoresis, fever and unexpected weight change.  HENT: Negative for congestion.   Eyes: Negative for visual disturbance.  Respiratory: Negative for cough, chest tightness, shortness of breath, wheezing and stridor.   Cardiovascular: Negative for chest pain, palpitations and leg swelling.  Gastrointestinal: Negative for abdominal distention, abdominal pain, blood in stool, constipation, diarrhea, nausea and vomiting.  Endocrine: Negative for cold intolerance, heat intolerance, polydipsia, polyphagia and polyuria.  Genitourinary: Negative for difficulty urinating, flank pain and hematuria.  Musculoskeletal: Negative for arthralgias, back pain, gait problem, joint swelling, myalgias, neck pain and neck stiffness.  Skin: Negative for color change, pallor, rash and wound.  Neurological: Negative for dizziness, tremors, weakness and headaches.  Hematological: Does not bruise/bleed easily.  Psychiatric/Behavioral: Negative for dysphoric mood, self-injury, sleep disturbance and suicidal ideas.       Objective:   Physical Exam  Constitutional: He is oriented to person, place, and time. He appears well-developed and well-nourished. No distress.   HENT:  Head: Normocephalic and atraumatic.  Right Ear: Hearing, tympanic membrane, external ear and ear canal normal. No decreased hearing is noted.  Left Ear: Hearing, tympanic membrane, external ear and ear canal normal. No decreased hearing is noted.  Nose: Nose normal.  Mouth/Throat: Uvula is midline, oropharynx is clear and moist and mucous membranes are normal. Abnormal dentition.  No upper teeth  Eyes: Conjunctivae are normal. Pupils are equal, round, and reactive to light.  Neck: Normal range of motion. Neck supple.  Cardiovascular: Normal rate and intact distal pulses. An irregular rhythm present.  Murmur heard. Pulmonary/Chest: Effort normal and breath sounds normal. No respiratory distress. He has no wheezes. He has no rales. He exhibits no tenderness.  Musculoskeletal: Normal range of motion.  Lymphadenopathy:    He has no cervical adenopathy.  Neurological: He is alert and oriented to person, place, and time. He displays normal  reflexes. No cranial nerve deficit. He exhibits normal muscle tone. Coordination normal.  Skin: Skin is warm and dry. No rash noted. He is not diaphoretic. No erythema. No pallor.  Psychiatric: He has a normal mood and affect. His behavior is normal. Judgment and thought content normal.  Nursing note and vitals reviewed.         Assessment & Plan:   1. Need for zoster vaccination   2. Dizziness   3. Blurred vision   4. Acute confusion   5. Numbness and tingling in both hands   6. Healthcare maintenance     Numbness and tingling in both hands He was seen jan 2019 for numbness/tingling, Lovastatin reduced from 40mg  to 20mg  QD. He reports 40% reduction in sx's since dosage cut in half. Will continue current dosage until after CT completed. If CT neg, will discuss reducing dosage Last LDL 07/17/17- 58  Acute confusion CT HEAD WO ordered  Healthcare maintenance Last LDL 07/17/17- 58 Monthly dizziness/blurred vision has been occurring > 15  years. Since you reported significant occurrence with profound confusion a few weeks- CT HEAD is advised. Continue all medications at current dosages. Please schedule follow-up appt after CT completed. If CT is normal, we can discuss reducing Lovastatin dosage. Increase water intake. Continue regular movement and heart healthy diet. Please call with any questions/concerns.  Dizziness Monthly dizziness/blurred vision has been occurring > 15 years. Since you reported significant occurrence with profound visual disturbance a few weeks- CT HEAD is advised. Continue all medications at current dosages. Please schedule follow-up appt after CT completed. If CT is normal, we can discuss reducing Lovastatin dosage. Increase water intake. Continue regular movement and heart healthy diet. Please call with any questions/concerns.    FOLLOW-UP:  Return in about 2 weeks (around 11/30/2017) for Discuss CT results.

## 2017-11-16 ENCOUNTER — Ambulatory Visit (INDEPENDENT_AMBULATORY_CARE_PROVIDER_SITE_OTHER): Payer: Medicare HMO | Admitting: Adult Health

## 2017-11-16 ENCOUNTER — Encounter: Payer: Self-pay | Admitting: Adult Health

## 2017-11-16 ENCOUNTER — Other Ambulatory Visit: Payer: Self-pay | Admitting: Adult Health

## 2017-11-16 VITALS — BP 119/67 | HR 53 | Ht 71.0 in | Wt 168.3 lb

## 2017-11-16 DIAGNOSIS — E039 Hypothyroidism, unspecified: Secondary | ICD-10-CM

## 2017-11-16 DIAGNOSIS — R410 Disorientation, unspecified: Secondary | ICD-10-CM | POA: Diagnosis not present

## 2017-11-16 DIAGNOSIS — Z23 Encounter for immunization: Secondary | ICD-10-CM | POA: Diagnosis not present

## 2017-11-16 DIAGNOSIS — R42 Dizziness and giddiness: Secondary | ICD-10-CM | POA: Diagnosis not present

## 2017-11-16 DIAGNOSIS — H538 Other visual disturbances: Secondary | ICD-10-CM

## 2017-11-16 DIAGNOSIS — R200 Anesthesia of skin: Secondary | ICD-10-CM

## 2017-11-16 DIAGNOSIS — Z0000 Encounter for general adult medical examination without abnormal findings: Secondary | ICD-10-CM | POA: Diagnosis not present

## 2017-11-16 DIAGNOSIS — R202 Paresthesia of skin: Secondary | ICD-10-CM | POA: Diagnosis not present

## 2017-11-16 MED ORDER — LOVASTATIN 20 MG PO TABS: 20.0000 mg | ORAL_TABLET | Freq: Every day | ORAL | 3 refills | Status: DC

## 2017-11-16 MED ORDER — ZOSTER VAC RECOMB ADJUVANTED 50 MCG/0.5ML IM SUSR: 0.5000 mL | Freq: Once | INTRAMUSCULAR | 0 refills | Status: AC

## 2017-11-16 MED ORDER — LOVASTATIN 10 MG PO TABS: 10.0000 mg | ORAL_TABLET | Freq: Every day | ORAL | 1 refills | Status: DC

## 2017-11-16 MED ORDER — LOVASTATIN 10 MG PO TABS: 20.0000 mg | ORAL_TABLET | Freq: Every day | ORAL | 1 refills | Status: DC

## 2017-11-16 NOTE — Assessment & Plan Note (Signed)
CT HEAD WO ordered

## 2017-11-16 NOTE — Assessment & Plan Note (Signed)
Monthly dizziness/blurred vision has been occurring > 15 years. Since you reported significant occurrence with profound visual disturbance a few weeks- CT HEAD is advised. Continue all medications at current dosages. Please schedule follow-up appt after CT completed. If CT is normal, we can discuss reducing Lovastatin dosage. Increase water intake. Continue regular movement and heart healthy diet. Please call with any questions/concerns.

## 2017-11-16 NOTE — Assessment & Plan Note (Signed)
He was seen jan 2019 for numbness/tingling, Lovastatin reduced from 40mg  to 20mg  QD. He reports 40% reduction in sx's since dosage cut in half. Will continue current dosage until after CT completed. If CT neg, will discuss reducing dosage Last LDL 07/17/17- 58

## 2017-11-16 NOTE — Assessment & Plan Note (Signed)
Last LDL 07/17/17- 58 Monthly dizziness/blurred vision has been occurring > 15 years. Since you reported significant occurrence with profound confusion a few weeks- CT HEAD is advised. Continue all medications at current dosages. Please schedule follow-up appt after CT completed. If CT is normal, we can discuss reducing Lovastatin dosage. Increase water intake. Continue regular movement and heart healthy diet. Please call with any questions/concerns.

## 2017-11-16 NOTE — Patient Instructions (Addendum)
Preventing Cerebrovascular Disease Arteries are blood vessels that carry blood that contains oxygen from the heart to all parts of the body. Cerebrovascular disease affects arteries that supply the brain. Any condition that blocks or disrupts blood flow to the brain can cause cerebrovascular disease. Brain cells that lose blood supply start to die within minutes (stroke). Stroke is the main danger of cerebrovascular disease. Atherosclerosis and high blood pressure are common causes of cerebrovascular disease. Atherosclerosis is narrowing and hardening of an artery that results when fat, cholesterol, calcium, or other substances (plaque) build up inside an artery. Plaque reduces blood flow through the artery. High blood pressure increases the risk of bleeding inside the brain. Making diet and lifestyle changes to prevent atherosclerosis and high blood pressure lowers your risk of cerebrovascular disease. What nutrition changes can be made?  Eat more fruits, vegetables, and whole grains.  Reduce how much saturated fat you eat. To do this, eat less red meat and fewer full-fat dairy products.  Eat healthy proteins instead of red meat. Healthy proteins include: ? Fish. Eat fish that contains heart-healthy omega-3 fatty acids, twice a week. Examples include salmon, albacore tuna, mackerel, and herring. ? Chicken. ? Nuts. ? Low-fat or nonfat yogurt.  Avoid processed meats, like bacon and lunchmeat.  Avoid foods that contain: ? A lot of sugar, such as sweets and drinks with added sugar. ? A lot of salt (sodium). Avoid adding extra salt to your food, as told by your health care provider. ? Trans fats, such as margarine and baked goods. Trans fats may be listed as "partially hydrogenated oils" on food labels.  Check food labels to see how much sodium, sugar, and trans fats are in foods.  Use vegetable oils that contain low amounts of saturated fat, such as olive oil or canola oil. What lifestyle  changes can be made?  Drink alcohol in moderation. This means no more than 1 drink a day for nonpregnant women and 2 drinks a day for men. One drink equals 12 oz of beer, 5 oz of wine, or 1 oz of hard liquor.  If you are overweight, ask your health care provider to recommend a weight-loss plan for you. Losing 5-10 lb (2.2-4.5 kg) can reduce your risk of diabetes, atherosclerosis, and high blood pressure.  Exercise for 30?60 minutes on most days, or as much as told by your health care provider. ? Do moderate-intensity exercise, such as brisk walking, bicycling, and water aerobics. Ask your health care provider which activities are safe for you.  Do not use any products that contain nicotine or tobacco, such as cigarettes and e-cigarettes. If you need help quitting, ask your health care provider. Why are these changes important? Making these changes lowers your risk of many diseases that can cause cerebrovascular disease and stroke. Stroke is a leading cause of death and disability. Making these changes also improves your overall health and quality of life. What can I do to lower my risk? The following factors make you more likely to develop cerebrovascular disease:  Being overweight.  Smoking.  Being physically inactive.  Eating a high-fat diet.  Having certain health conditions, such as: ? Diabetes. ? High blood pressure. ? Heart disease. ? Atherosclerosis. ? High cholesterol. ? Sickle cell disease.  Talk with your health care provider about your risk for cerebrovascular disease. Work with your health care provider to control diseases that you have that may contribute to cerebrovascular disease. Your health care provider may prescribe medicines to help prevent   major causes of cerebrovascular disease. Where to find more information: Learn more about preventing cerebrovascular disease from:  [place], Lung, and [place]:  Mo[employer]st.de  Centers for Disease Control and Prevention: http://www.curry-wood.biz/  Summary  Cerebrovascular disease can lead to a stroke.  Atherosclerosis and high blood pressure are major causes of cerebrovascular disease.  Making diet and lifestyle changes can reduce your risk of cerebrovascular disease.  Work with your health care provider to get your risk factors under control to reduce your risk of cerebrovascular disease. This information is not intended to replace advice given to you by your health care provider. Make sure you discuss any questions you have with your health care provider. Document Released: 09/13/2015 Document Revised: 03/18/2016 Document Reviewed: 09/13/2015 Elsevier Interactive Patient Education  2018 [employer].   Last LDL 07/17/17- 58 Monthly dizziness/blurred vision has been occurring > 15 years. Since you reported significant occurrence with profound visual change a few weeks- CT HEAD is advised. Continue all medications at current dosages. Please schedule follow-up appt after CT completed. If CT is normal, we can discuss reducing Lovastatin dosage. Increase water intake. Continue regular movement and heart healthy diet. Please call with any questions/concerns.

## 2017-11-29 NOTE — Progress Notes (Signed)
Subjective:    Patient ID: Carlos [name], male    DOB: [birth year], 67 y.o.   MRN: [id number]  HPI:  05/18/17 OV: [name] is here for CPE.   PMH: Hypothyroidism and HL. He has sig family hx of prostate ca-father and brother.  He denies urinary sx's, however would like to have PSA level checked with labs today.  He eats "on ok diet" and "runs around with me grandson", overall he feels like his health is good. He is compliant on all medications and denies SE. He reports becoming a "little winded" when he is "rough-housing or running after" his 82 year old grandson [name]. He denies CP/dyspnea at rest/palpitations/dizziness. Healthcare maintenance: Colonoscopy-completed March 2016, instructed to repeat in 10 years. No smoking hx, therefore AAA screening not required.   11/16/17 OV: [name] is here for regular f/u: He was seen jan 2019 for numbness/tingling, Lovastatin reduced from 40mg  to 20mg  QD. He reports 40% reduction in sx's since dosage cut in half. He reports monthly dizziness/blurred vision has been occurring > 15 years. He experienced profound visual disturbance a few weeks ago when he was driving to [facility], which required him to pull off road until vision returned to normal. He reports the most sig "occurrence" happened 15 years ago, and in addition to dizziness/vision change he could not recall phone numbers of near by friends to call for help. He denies migraine hx. He had recent eye examination with dilation Jan 2019- per pt normal CT Head WO strongly recommended due to frequency and recent severity of sx's. He denies family hx of MI/CVA   11/06/17 OV: [name] is here to discuss recent results of Head CT- study was normal He denies any recent CP/dyspnea/palpitations/dizziness He reports sig reduction in upper ext numbness/tingling since Lovastatin dosage reduced from 40-20mg  We reviewed CT results at length today- all questions/concerns answered.  Patient  Care Team    Relationship Specialty Notifications Start End  Mina Marble D, NP PCP - General Family Medicine  12/15/16   Gatha Mayer, MD Consulting Physician Gastroenterology  12/15/16     Patient Active Problem List   Diagnosis Date Noted  . Blurred vision 11/16/2017  . Acute confusion 11/16/2017  . Dizziness 11/16/2017  . Need for zoster vaccination 11/16/2017  . Numbness and tingling in both hands 09/14/2017  . Family history of diabetes mellitus (DM) 06/21/2017  . Hyperlipidemia 05/22/2017  . Heart murmur 05/18/2017  . Healthcare maintenance 12/15/2016  . Family history of prostate cancer 04/21/2016  . Nevus of neck 04/21/2016  . Colon cancer screening 09/23/2014  . HLD (hyperlipidemia) 02/18/2014  . Hypothyroidism 02/18/2014  . BMI 25.0-25.9,adult 02/18/2014  . Tobacco abuse counseling 02/18/2014     Past Medical History:  Diagnosis Date  . HLD (hyperlipidemia)   . Hypothyroidism   . Irregular heartbeat    self-reported "slow heartbeat"     Past Surgical History:  Procedure Laterality Date  . NO PAST SURGERIES     as of 02/2014     Family History  Problem Relation Age of Onset  . Prostate cancer Father        Spread to bones  . Alcoholism Other        Mother and Father  . COPD Brother        Emphysema  . Diabetes Brother        neither required medicine  . Heart disease Other        Grandparents  . Early  death Other        Mother, 27 (PNA); Father, 97 (cancer)  . Hypertension Brother   . Hyperlipidemia Brother   . Thyroid disease Other        Brother and grandfather  . Colon cancer Neg Hx      Social History   Substance and Sexual Activity  Drug Use No     Social History   Substance and Sexual Activity  Alcohol Use No  . Alcohol/week: 0.0 oz     Social History   Tobacco Use  Smoking Status Never Smoker  Smokeless Tobacco Current User  . Types: Chew     Outpatient Encounter Medications as of 12/04/2017  Medication Sig  .  ALOE VERA PO Take 1 capsule by mouth daily.  Marland Kitchen aspirin 81 MG tablet Take 81 mg by mouth daily.  Marland Kitchen levothyroxine (SYNTHROID, LEVOTHROID) 88 MCG tablet Take 1 tablet (88 mcg total) by mouth daily.  Marland Kitchen lovastatin (MEVACOR) 20 MG tablet Take 1 tablet (20 mg total) by mouth at bedtime.  . Multiple Vitamins-Minerals (MULTIVITAMIN GUMMIES ADULT PO) Take 1 each by mouth daily.  Marland Kitchen OVER THE COUNTER MEDICATION Take 200 mcg by mouth daily. SELENIUM SUPPLEMENT - OTC DAILY  . OVER THE COUNTER MEDICATION Take 2.5 g by mouth daily. SPIRULINA - OTC DAILY  . OVER THE COUNTER MEDICATION Take 2 g by mouth daily. BARLEY GRASS - OTC DAILY  . OVER THE COUNTER MEDICATION Turmeric 450mg  2-3 times per week  . Probiotic Product (SOLUBLE FIBER/PROBIOTICS PO) Take 1 capsule by mouth 1 day or 1 dose.  . vitamin E 200 UNIT capsule Take 200 Units by mouth daily.   No facility-administered encounter medications on file as of 12/04/2017.     Allergies: Patient has no known allergies.  There is no height or weight on file to calculate BMI.  There were no vitals taken for this visit.   Review of Systems  Constitutional: Negative for activity change, appetite change, chills, diaphoresis, fever and unexpected weight change.  HENT: Negative for congestion.   Eyes: Negative for visual disturbance.  Respiratory: Negative for cough, chest tightness, shortness of breath, wheezing and stridor.   Cardiovascular: Negative for chest pain, palpitations and leg swelling.  Endocrine: Negative for cold intolerance, heat intolerance, polydipsia, polyphagia and polyuria.  Musculoskeletal: Negative for arthralgias, back pain, gait problem, joint swelling, myalgias, neck pain and neck stiffness.  Skin: Negative for color change, pallor, rash and wound.  Neurological: Negative for dizziness, tremors, weakness and headaches.  Hematological: Does not bruise/bleed easily.  Psychiatric/Behavioral: Negative for dysphoric mood, self-injury,  sleep disturbance and suicidal ideas.       Objective:   Physical Exam  Constitutional: He is oriented to person, place, and time. He appears well-developed and well-nourished. No distress.  HENT:  Head: Normocephalic and atraumatic.  Right Ear: Hearing, tympanic membrane, external ear and ear canal normal. No decreased hearing is noted.  Left Ear: Hearing, tympanic membrane, external ear and ear canal normal. No decreased hearing is noted.  Nose: Nose normal.  Mouth/Throat: Uvula is midline, oropharynx is clear and moist and mucous membranes are normal. Abnormal dentition.  No upper teeth  Eyes: Conjunctivae are normal. Pupils are equal, round, and reactive to light.  Cardiovascular: Normal rate and intact distal pulses. An irregular rhythm present.  Murmur heard. Pulmonary/Chest: Effort normal and breath sounds normal. No respiratory distress. He has no wheezes. He has no rales. He exhibits no tenderness.  Musculoskeletal: Normal range of motion.  Neurological: He is alert and oriented to person, place, and time. He displays normal reflexes. No cranial nerve deficit. He exhibits normal muscle tone. Coordination normal.  Skin: Skin is warm and dry. No rash noted. He is not diaphoretic. No erythema. No pallor.  Psychiatric: He has a normal mood and affect. His behavior is normal. Judgment and thought content normal.  Nursing note and vitals reviewed.         Assessment & Plan:   1. Dizziness   2. Healthcare maintenance     Healthcare maintenance Continue all mediations as directed. Again, HEAD CT was normal. Please call [facility], re: shingles vaccination. Increase water intake, follow heart healthy diet, and continue to move as often as possible. Please follow-up in Sept for physical.  Dizziness CT HEAD WO 11/30/17- CLINICAL DATA:  Dizziness. Acute confusion. Recent TIAs. Blurred vision. TECHNIQUE: Contiguous axial images were obtained from the base of the skull through  the vertex without intravenous contrast. COMPARISON:  None. FINDINGS: Brain: No acute infarct, hemorrhage, or mass lesion is present. The ventricles are of normal size. No significant white matter disease is present. The brainstem and cerebellum are normal. No significant extra-axial fluid collections are present  Vascular: No hyperdense vessel or unexpected calcification.  Skull: Calvarium is intact. No focal lytic or blastic lesions are present. Sinuses/Orbits: The paranasal sinuses and mastoid air cells are clear. Globes and orbits are within normal limits. IMPRESSION: Negative CT of the head.    FOLLOW-UP:  Return in about 6 months (around 06/06/2018) for CPE. No diagnosis found.  No problem-specific Assessment & Plan notes found for this encounter.    FOLLOW-UP:  No Follow-up on file.

## 2017-11-30 ENCOUNTER — Ambulatory Visit
Admission: RE | Admit: 2017-11-30 | Discharge: 2017-11-30 | Disposition: A | Discharge status: Home / Self Care | Payer: Medicare HMO | Source: Ambulatory Visit | Attending: Adult Health | Admitting: Adult Health

## 2017-11-30 DIAGNOSIS — G459 Transient cerebral ischemic attack, unspecified: Secondary | ICD-10-CM | POA: Diagnosis not present

## 2017-11-30 DIAGNOSIS — R410 Disorientation, unspecified: Secondary | ICD-10-CM | POA: Diagnosis not present

## 2017-12-04 ENCOUNTER — Ambulatory Visit (INDEPENDENT_AMBULATORY_CARE_PROVIDER_SITE_OTHER): Payer: Medicare HMO | Admitting: Adult Health

## 2017-12-04 ENCOUNTER — Encounter: Payer: Self-pay | Admitting: Adult Health

## 2017-12-04 VITALS — BP 133/80 | HR 69 | Ht 71.0 in | Wt 168.7 lb

## 2017-12-04 DIAGNOSIS — Z0000 Encounter for general adult medical examination without abnormal findings: Secondary | ICD-10-CM | POA: Diagnosis not present

## 2017-12-04 DIAGNOSIS — R42 Dizziness and giddiness: Secondary | ICD-10-CM | POA: Diagnosis not present

## 2017-12-04 NOTE — Patient Instructions (Addendum)

## 2017-12-04 NOTE — Assessment & Plan Note (Signed)
Continue all mediations as directed. Again, HEAD CT was normal. Please call [facility], re: shingles vaccination. Increase water intake, follow heart healthy diet, and continue to move as often as possible. Please follow-up in Sept for physical.

## 2017-12-04 NOTE — Assessment & Plan Note (Signed)
CT HEAD WO 11/30/17- CLINICAL DATA:  Dizziness. Acute confusion. Recent TIAs. Blurred vision. TECHNIQUE: Contiguous axial images were obtained from the base of the skull through the vertex without intravenous contrast. COMPARISON:  None. FINDINGS: Brain: No acute infarct, hemorrhage, or mass lesion is present. The ventricles are of normal size. No significant white matter disease is present. The brainstem and cerebellum are normal. No significant extra-axial fluid collections are present  Vascular: No hyperdense vessel or unexpected calcification.  Skull: Calvarium is intact. No focal lytic or blastic lesions are present. Sinuses/Orbits: The paranasal sinuses and mastoid air cells are clear. Globes and orbits are within normal limits. IMPRESSION: Negative CT of the head.

## 2018-01-30 ENCOUNTER — Other Ambulatory Visit: Payer: Self-pay | Admitting: Adult Health

## 2018-03-19 ENCOUNTER — Telehealth: Payer: Self-pay | Admitting: Adult Health

## 2018-03-19 MED ORDER — LEVOTHYROXINE SODIUM 88 MCG PO TABS: 88.0000 ug | ORAL_TABLET | Freq: Every day | ORAL | 1 refills | Status: DC

## 2018-03-19 NOTE — Telephone Encounter (Signed)
Patient was advised by Kenney Houseman to call back for thyroid med refill in early July. He is requesting this refill to be sent to [facility] if approved.

## 2018-03-19 NOTE — Addendum Note (Signed)
Addended by: Fonnie Mu on: 03/19/2018 11:13 AM   Modules accepted: Orders

## 2018-06-07 ENCOUNTER — Other Ambulatory Visit (INDEPENDENT_AMBULATORY_CARE_PROVIDER_SITE_OTHER): Payer: Medicare HMO

## 2018-06-07 DIAGNOSIS — E785 Hyperlipidemia, unspecified: Secondary | ICD-10-CM

## 2018-06-07 DIAGNOSIS — Z0000 Encounter for general adult medical examination without abnormal findings: Secondary | ICD-10-CM

## 2018-06-07 DIAGNOSIS — E038 Other specified hypothyroidism: Secondary | ICD-10-CM

## 2018-06-07 DIAGNOSIS — E7849 Other hyperlipidemia: Secondary | ICD-10-CM | POA: Diagnosis not present

## 2018-06-08 LAB — COMPREHENSIVE METABOLIC PANEL
ALBUMIN: 4.7 g/dL (ref 3.6–4.8)
ALK PHOS: 74 IU/L (ref 39–117)
ALT: 13 IU/L (ref 0–44)
AST: 20 IU/L (ref 0–40)
Albumin/Globulin Ratio: 3.4 — ABNORMAL HIGH (ref 1.2–2.2)
BUN / CREAT RATIO: 14 (ref 10–24)
BUN: 16 mg/dL (ref 8–27)
Bilirubin Total: 0.8 mg/dL (ref 0.0–1.2)
CO2: 23 mmol/L (ref 20–29)
CREATININE: 1.15 mg/dL (ref 0.76–1.27)
Calcium: 9.3 mg/dL (ref 8.6–10.2)
Chloride: 106 mmol/L (ref 96–106)
GFR calc Af Amer: 75 mL/min/{1.73_m2} (ref >59)
GFR calc non Af Amer: 65 mL/min/{1.73_m2} (ref >59)
GLUCOSE: 90 mg/dL (ref 65–99)
Globulin, Total: 1.4 g/dL — ABNORMAL LOW (ref 1.5–4.5)
Potassium: 4.2 mmol/L (ref 3.5–5.2)
Sodium: 146 mmol/L — ABNORMAL HIGH (ref 134–144)
TOTAL PROTEIN: 6.1 g/dL (ref 6.0–8.5)

## 2018-06-08 LAB — CBC WITH DIFFERENTIAL/PLATELET
Basophils Absolute: 0.1 10*3/uL (ref 0.0–0.2)
Basos: 1 %
EOS (ABSOLUTE): 0.2 10*3/uL (ref 0.0–0.4)
EOS: 4 %
HEMATOCRIT: 44.4 % (ref 37.5–51.0)
Hemoglobin: 14.8 g/dL (ref 13.0–17.7)
Immature Grans (Abs): 0 10*3/uL (ref 0.0–0.1)
Immature Granulocytes: 0 %
LYMPHS ABS: 1.4 10*3/uL (ref 0.7–3.1)
Lymphs: 28 %
MCH: 30 pg (ref 26.6–33.0)
MCHC: 33.3 g/dL (ref 31.5–35.7)
MCV: 90 fL (ref 79–97)
MONOCYTES: 10 %
Monocytes Absolute: 0.5 10*3/uL (ref 0.1–0.9)
NEUTROS ABS: 3 10*3/uL (ref 1.4–7.0)
Neutrophils: 57 %
Platelets: 182 10*3/uL (ref 150–450)
RBC: 4.94 x10E6/uL (ref 4.14–5.80)
RDW: 12 % — ABNORMAL LOW (ref 12.3–15.4)
WBC: 5.1 10*3/uL (ref 3.4–10.8)

## 2018-06-08 LAB — LIPID PANEL
Chol/HDL Ratio: 3.2 ratio (ref 0.0–5.0)
Cholesterol, Total: 145 mg/dL (ref 100–199)
HDL: 45 mg/dL (ref >39)
LDL Calculated: 84 mg/dL (ref 0–99)
Triglycerides: 82 mg/dL (ref 0–149)
VLDL Cholesterol Cal: 16 mg/dL (ref 5–40)

## 2018-06-08 LAB — THYROID PANEL WITH TSH
Free Thyroxine Index: 3 (ref 1.2–4.9)
T3 Uptake Ratio: 35 % (ref 24–39)
T4, Total: 8.7 ug/dL (ref 4.5–12.0)
TSH: 1.9 u[IU]/mL (ref 0.450–4.500)

## 2018-06-08 LAB — HEMOGLOBIN A1C
Est. average glucose Bld gHb Est-mCnc: 111 mg/dL
Hgb A1c MFr Bld: 5.5 % (ref 4.8–5.6)

## 2018-06-12 ENCOUNTER — Ambulatory Visit (INDEPENDENT_AMBULATORY_CARE_PROVIDER_SITE_OTHER): Payer: Medicare HMO | Admitting: Adult Health

## 2018-06-12 ENCOUNTER — Encounter: Payer: Self-pay | Admitting: Adult Health

## 2018-06-12 VITALS — BP 108/72 | HR 73 | Ht 71.0 in | Wt 169.1 lb

## 2018-06-12 DIAGNOSIS — E785 Hyperlipidemia, unspecified: Secondary | ICD-10-CM

## 2018-06-12 DIAGNOSIS — E038 Other specified hypothyroidism: Secondary | ICD-10-CM | POA: Diagnosis not present

## 2018-06-12 DIAGNOSIS — Z23 Encounter for immunization: Secondary | ICD-10-CM | POA: Diagnosis not present

## 2018-06-12 DIAGNOSIS — R011 Cardiac murmur, unspecified: Secondary | ICD-10-CM | POA: Diagnosis not present

## 2018-06-12 DIAGNOSIS — Z0000 Encounter for general adult medical examination without abnormal findings: Secondary | ICD-10-CM

## 2018-06-12 MED ORDER — ZOSTER VAC RECOMB ADJUVANTED 50 MCG/0.5ML IM SUSR: 0.5000 mL | Freq: Once | INTRAMUSCULAR | 0 refills | Status: AC

## 2018-06-12 MED ORDER — LOVASTATIN 40 MG PO TABS: 40.0000 mg | ORAL_TABLET | Freq: Every day | ORAL | Status: DC

## 2018-06-12 NOTE — Progress Notes (Signed)
Subjective:    Patient ID: Carlos [name], male    DOB: 1951-07-20, 67 y.o.   MRN: [id number]  HPI:  Carlos [name] presents for CPE He reports numbness/tingling in upper extremity has only improved slightly since reduction in statin. He continues to remain quite active, he can climb >10 flights of stairs without being winded. He continues to dip, declined tobacco cessation. He continues to abstain from ETOH He estimates dizziness/blurred vision to be decreasing in frequency, "maybe once every other month now instead of monthly". Reviewed all recent labs The 10-year ASCVD risk score Carlos [name]) is: 11.3%   Values used to calculate the score:     Age: 11 years     Sex: Male     Is Non-Hispanic African American: No     Diabetic: No     Tobacco smoker: No     Systolic Blood Pressure: 782 mmHg     Is BP treated: No     HDL Cholesterol: 45 mg/dL     Total Cholesterol: 145 mg/dL  LDL- 84  He refused to dress in gown for physical  Healthcare Maintenance: Colonoscopy-UTD Immunizations-Needs Shingrax and Prevnar  Patient Care Team    Relationship Specialty Notifications Start End  Carlos [name] PCP - General Family Medicine  12/15/16   Carlos [name] Consulting Physician Gastroenterology  12/15/16     Patient Active Problem List   Diagnosis Date Noted  . Blurred vision 11/16/2017  . Acute confusion 11/16/2017  . Dizziness 11/16/2017  . Need for zoster vaccination 11/16/2017  . Numbness and tingling in both hands 09/14/2017  . Family history of diabetes mellitus (DM) 06/21/2017  . Hyperlipidemia 05/22/2017  . Heart murmur 05/18/2017  . Healthcare maintenance 12/15/2016  . Family history of prostate cancer 04/21/2016  . Nevus of neck 04/21/2016  . Colon cancer screening 09/23/2014  . HLD (hyperlipidemia) 02/18/2014  . Hypothyroidism 02/18/2014  . BMI 25.0-25.9,adult 02/18/2014  . Tobacco abuse counseling 02/18/2014     Past Medical History:   Diagnosis Date  . HLD (hyperlipidemia)   . Hypothyroidism   . Irregular heartbeat    self-reported "slow heartbeat"     Past Surgical History:  Procedure Laterality Date  . NO PAST SURGERIES     as of 02/2014     Family History  Problem Relation Age of Onset  . Prostate cancer Father        Spread to bones  . Alcoholism Other        Mother and Father  . COPD Brother        Emphysema  . Diabetes Brother        neither required medicine  . Heart disease Other        Grandparents  . Early death Other        Mother, 35 (PNA); Father, 43 (cancer)  . Hypertension Brother   . Hyperlipidemia Brother   . Thyroid disease Other        Brother and grandfather  . Colon cancer Neg Hx      Social History   Substance and Sexual Activity  Drug Use No     Social History   Substance and Sexual Activity  Alcohol Use No  . Alcohol/week: 0.0 standard drinks     Social History   Tobacco Use  Smoking Status Never Smoker  Smokeless Tobacco Current User  . Types: Chew     Outpatient Encounter Medications as of 06/12/2018  Medication Sig  . ALOE VERA PO Take 1 capsule by mouth daily.  Marland Kitchen aspirin 81 MG tablet Take 81 mg by mouth daily.  Marland Kitchen levothyroxine (SYNTHROID, LEVOTHROID) 88 MCG tablet Take 1 tablet (88 mcg total) by mouth daily.  Marland Kitchen lovastatin (MEVACOR) 40 MG tablet Take 1 tablet (40 mg total) by mouth at bedtime.  . Multiple Vitamins-Minerals (MULTIVITAMIN GUMMIES ADULT PO) Take 1 each by mouth daily.  Marland Kitchen OVER THE COUNTER MEDICATION Take 200 mcg by mouth daily. SELENIUM SUPPLEMENT - OTC DAILY  . OVER THE COUNTER MEDICATION Take 2.5 g by mouth daily. SPIRULINA - OTC DAILY  . OVER THE COUNTER MEDICATION Take 2 g by mouth daily. BARLEY GRASS - OTC DAILY  . OVER THE COUNTER MEDICATION Turmeric 450mg  2-3 times per week  . Probiotic Product (SOLUBLE FIBER/PROBIOTICS PO) Take 1 capsule by mouth 1 day or 1 dose.  . vitamin E 200 UNIT capsule Take 100 Units by mouth daily.   .  [DISCONTINUED] lovastatin (MEVACOR) 20 MG tablet Take 1 tablet (20 mg total) by mouth at bedtime.  Marland Kitchen Zoster Vaccine Adjuvanted [facility]) injection Inject 0.5 mLs into the muscle once for 1 dose.   No facility-administered encounter medications on file as of 06/12/2018.     Allergies: Patient has no known allergies.  Body mass index is 23.58 kg/m.  Blood pressure 108/72, pulse 73, height 5\' 11"  (1.803 m), weight 169 lb 1.6 oz (76.7 kg), SpO2 97 %.  Review of Systems  Constitutional: Positive for fatigue. Negative for activity change, appetite change, chills, diaphoresis, fever and unexpected weight change.  HENT: Negative for congestion.   Eyes: Negative for visual disturbance.  Respiratory: Negative for cough, chest tightness, shortness of breath, wheezing and stridor.   Cardiovascular: Negative for chest pain, palpitations and leg swelling.  Gastrointestinal: Negative for abdominal distention, anal bleeding, blood in stool, constipation, diarrhea, nausea and vomiting.  Endocrine: Negative for cold intolerance, heat intolerance, polydipsia and polyuria.  Genitourinary: Negative for difficulty urinating and flank pain.  Musculoskeletal: Negative for arthralgias, back pain, gait problem, joint swelling, myalgias, neck pain and neck stiffness.  Skin: Negative for color change, pallor, rash and wound.  Neurological: Positive for dizziness. Negative for headaches.  Hematological: Does not bruise/bleed easily.  Psychiatric/Behavioral: Negative for behavioral problems, confusion, decreased concentration, dysphoric mood, hallucinations, self-injury, sleep disturbance and suicidal ideas. The patient is not nervous/anxious and is not hyperactive.        Objective:   Physical Exam  Constitutional: He is oriented to person, place, and time. He appears well-developed and well-nourished. No distress.  HENT:  Head: Normocephalic and atraumatic.  Right Ear: External ear normal. Tympanic membrane  is not erythematous and not bulging. No decreased hearing is noted.  Left Ear: External ear normal. Tympanic membrane is not erythematous and not bulging. No decreased hearing is noted.  Nose: Nose normal. No mucosal edema or rhinorrhea. Right sinus exhibits no maxillary sinus tenderness and no frontal sinus tenderness. Left sinus exhibits no maxillary sinus tenderness and no frontal sinus tenderness.  Mouth/Throat: Oropharynx is clear and moist. Abnormal dentition.  Eyes: Pupils are equal, round, and reactive to light. Conjunctivae and EOM are normal.  Neck: Normal range of motion. Neck supple.  Cardiovascular: Normal rate, regular rhythm and intact distal pulses.  Murmur heard. Pulmonary/Chest: Effort normal and breath sounds normal. No stridor. No respiratory distress. He has no wheezes. He has no rales. He exhibits no tenderness.  Abdominal: Soft. Bowel sounds are normal. He exhibits no  distension and no mass. There is no tenderness. There is no rebound and no guarding. No hernia.  Musculoskeletal: Normal range of motion.  Lymphadenopathy:    He has no cervical adenopathy.  Neurological: He is alert and oriented to person, place, and time. He displays normal reflexes. No cranial nerve deficit. Coordination normal.  Skin: Skin is warm and dry. Capillary refill takes less than 2 seconds. No rash noted. He is not diaphoretic. No erythema. No pallor.  Psychiatric: He has a normal mood and affect. His behavior is normal. Judgment and thought content normal.  Nursing note and vitals reviewed.         Assessment & Plan:   1. Need for zoster vaccination   2. Other specified hypothyroidism   3. Healthcare maintenance   4. Hyperlipidemia, unspecified hyperlipidemia type   5. Heart murmur     Healthcare maintenance Your blood pressure and weight are excellent! Since LDL increased from 58 to 84, please increase lovastatin from 20mg  to 40mg - new prescription sent into [facility] mail order. We  will check PSA next year, sooner if you develop any urinary symptoms. If you experience 3 episodes of dizziness, confusion, time lapse, memory issues, or speech difficulty in the next 7 weeks- please call clinic and we will refer you to Neurology. Recommend regular follow-up in 6 months.  Hyperlipidemia The 10-year ASCVD risk score Carlos Bussing DC Jr., et al., [name]) is: 11.3%   Values used to calculate the score:     Age: 10 years     Sex: Male     Is Non-Hispanic African American: No     Diabetic: No     Tobacco smoker: No     Systolic Blood Pressure: 751 mmHg     Is BP treated: No     HDL Cholesterol: 45 mg/dL     Total Cholesterol: 145 mg/dL  LDL-84 Since LDL increased from 58 to 84, please increase lovastatin from 20mg  to 40mg - new prescription sent into [facility] mail order.  Hypothyroidism Last Thyroid panel WNL No change in Levothyroxine dosage  Heart murmur Recommend evaluation by Cardiology, he declined     FOLLOW-UP:  No follow-ups on file.

## 2018-06-12 NOTE — Assessment & Plan Note (Signed)
The 10-year ASCVD risk score [name], et al., 2013) is: 11.3%   Values used to calculate the score:     Age: 67 years     Sex: Male     Is Non-Hispanic African American: No     Diabetic: No     Tobacco smoker: No     Systolic Blood Pressure: 707 mmHg     Is BP treated: No     HDL Cholesterol: 45 mg/dL     Total Cholesterol: 145 mg/dL  LDL-84 Since LDL increased from 58 to 84, please increase lovastatin from 20mg  to 40mg - new prescription sent into [facility] mail order.

## 2018-06-12 NOTE — Patient Instructions (Addendum)
Preventive Care for Adults, Male A healthy lifestyle and preventive care can promote health and wellness. Preventive health guidelines for men include the following key practices:  A routine yearly physical is a good way to check with your health care provider about your health and preventative screening. It is a chance to share any concerns and updates on your health and to receive a thorough exam.  Visit your dentist for a routine exam and preventative care every 6 months. Brush your teeth twice a day and floss once a day. Good oral hygiene prevents tooth decay and gum disease.  The frequency of eye exams is based on your age, health, family medical history, use of contact lenses, and other factors. Follow your health care provider's recommendations for frequency of eye exams.  Eat a healthy diet. Foods such as vegetables, fruits, whole grains, low-fat dairy products, and lean protein foods contain the nutrients you need without too many calories. Decrease your intake of foods high in solid fats, added sugars, and salt. Eat the right amount of calories for you.Get information about a proper diet from your health care provider, if necessary.  Regular physical exercise is one of the most important things you can do for your health. Most adults should get at least 150 minutes of moderate-intensity exercise (any activity that increases your heart rate and causes you to sweat) each week. In addition, most adults need muscle-strengthening exercises on 2 or more days a week.  Maintain a healthy weight. The body mass index (BMI) is a screening tool to identify possible weight problems. It provides an estimate of body fat based on height and weight. Your health care provider can find your BMI and can help you achieve or maintain a healthy weight.For adults 20 years and older:  A BMI below 18.5 is considered underweight.  A BMI of 18.5 to 24.9 is normal.  A BMI of 25 to 29.9 is considered  overweight.  A BMI of 30 and above is considered obese.  Maintain normal blood lipids and cholesterol levels by exercising and minimizing your intake of saturated fat. Eat a balanced diet with plenty of fruit and vegetables. Blood tests for lipids and cholesterol should begin at age 20 and be repeated every 5 years. If your lipid or cholesterol levels are high, you are over 50, or you are at high risk for heart disease, you may need your cholesterol levels checked more frequently.Ongoing high lipid and cholesterol levels should be treated with medicines if diet and exercise are not working.  If you smoke, find out from your health care provider how to quit. If you do not use tobacco, do not start.  Lung cancer screening is recommended for adults aged 82-80 years who are at high risk for developing lung cancer because of a history of smoking. A yearly low-dose CT scan of the lungs is recommended for people who have at least a 30-pack-year history of smoking and are a current smoker or have quit within the past 15 years. A pack year of smoking is smoking an average of 1 pack of cigarettes a day for 1 year (for example: 1 pack a day for 30 years or 2 packs a day for 15 years). Yearly screening should continue until the smoker has stopped smoking for at least 15 years. Yearly screening should be stopped for people who develop a health problem that would prevent them from having lung cancer treatment.  If you choose to drink alcohol, do not have more  than 2 drinks per day. One drink is considered to be 12 ounces (355 mL) of beer, 5 ounces (148 mL) of wine, or 1.5 ounces (44 mL) of liquor.  Avoid use of street drugs. Do not share needles with anyone. Ask for help if you need support or instructions about stopping the use of drugs.  High blood pressure causes heart disease and increases the risk of stroke. Your blood pressure should be checked at least every 1-2 years. Ongoing high blood pressure should be  treated with medicines, if weight loss and exercise are not effective.  If you are 46-30 years old, ask your health care provider if you should take aspirin to prevent heart disease.  Diabetes screening is done by taking a blood sample to check your blood glucose level after you have not eaten for a certain period of time (fasting). If you are not overweight and you do not have risk factors for diabetes, you should be screened once every 3 years starting at age 71. If you are overweight or obese and you are 59-84 years of age, you should be screened for diabetes every year as part of your cardiovascular risk assessment.  Colorectal cancer can be detected and often prevented. Most routine colorectal cancer screening begins at the age of 62 and continues through age 26. However, your health care provider may recommend screening at an earlier age if you have risk factors for colon cancer. On a yearly basis, your health care provider may provide home test kits to check for hidden blood in the stool. Use of a small camera at the end of a tube to directly examine the colon (sigmoidoscopy or colonoscopy) can detect the earliest forms of colorectal cancer. Talk to your health care provider about this at age 32, when routine screening begins. Direct exam of the colon should be repeated every 5-10 years through age 74, unless early forms of precancerous polyps or small growths are found.  People who are at an increased risk for hepatitis B should be screened for this virus. You are considered at high risk for hepatitis B if:  You were born in a country where hepatitis B occurs often. Talk with your health care provider about which countries are considered high risk.  Your parents were born in a high-risk country and you have not received a shot to protect against hepatitis B (hepatitis B vaccine).  You have HIV or AIDS.  You use needles to inject street drugs.  You live with, or have sex with, someone who  has hepatitis B.  You are a man who has sex with other men (MSM).  You get hemodialysis treatment.  You take certain medicines for conditions such as cancer, organ transplantation, and autoimmune conditions.  Hepatitis C blood testing is recommended for all people born from [birth year] through 1965 and any individual with known risks for hepatitis C.  Practice safe sex. Use condoms and avoid high-risk sexual practices to reduce the spread of sexually transmitted infections (STIs). STIs include gonorrhea, chlamydia, syphilis, trichomonas, herpes, HPV, and human immunodeficiency virus (HIV). Herpes, HIV, and HPV are viral illnesses that have no cure. They can result in disability, cancer, and death.  If you are a man who has sex with other men, you should be screened at least once per year for:  HIV.  Urethral, rectal, and pharyngeal infection of gonorrhea, chlamydia, or both.  If you are at risk of being infected with HIV, it is recommended that you take a  prescription medicine daily to prevent HIV infection. This is called preexposure prophylaxis (PrEP). You are considered at risk if:  You are a man who has sex with other men (MSM) and have other risk factors.  You are a heterosexual man, are sexually active, and are at increased risk for HIV infection.  You take drugs by injection.  You are sexually active with a partner who has HIV.  Talk with your health care provider about whether you are at high risk of being infected with HIV. If you choose to begin PrEP, you should first be tested for HIV. You should then be tested every 3 months for as long as you are taking PrEP.  A one-time screening for abdominal aortic aneurysm (AAA) and surgical repair of large AAAs by ultrasound are recommended for men ages 77 to 52 years who are current or former smokers.  Healthy men should no longer receive prostate-specific antigen (PSA) blood tests as part of routine cancer screening. Talk with your health  care provider about prostate cancer screening.  Testicular cancer screening is not recommended for adult males who have no symptoms. Screening includes self-exam, a health care provider exam, and other screening tests. Consult with your health care provider about any symptoms you have or any concerns you have about testicular cancer.  Use sunscreen. Apply sunscreen liberally and repeatedly throughout the day. You should seek shade when your shadow is shorter than you. Protect yourself by wearing long sleeves, pants, a wide-brimmed hat, and sunglasses year round, whenever you are outdoors.  Once a month, do a whole-body skin exam, using a mirror to look at the skin on your back. Tell your health care provider about new moles, moles that have irregular borders, moles that are larger than a pencil eraser, or moles that have changed in shape or color.  Stay current with required vaccines (immunizations).  Influenza vaccine. All adults should be immunized every year.  Tetanus, diphtheria, and acellular pertussis (Td, Tdap) vaccine. An adult who has not previously received Tdap or who does not know his vaccine status should receive 1 dose of Tdap. This initial dose should be followed by tetanus and diphtheria toxoids (Td) booster doses every 10 years. Adults with an unknown or incomplete history of completing a 3-dose immunization series with Td-containing vaccines should begin or complete a primary immunization series including a Tdap dose. Adults should receive a Td booster every 10 years.  Varicella vaccine. An adult without evidence of immunity to varicella should receive 2 doses or a second dose if he has previously received 1 dose.  Human papillomavirus (HPV) vaccine. Males aged 11-21 years who have not received the vaccine previously should receive the 3-dose series. Males aged 22-26 years may be immunized. Immunization is recommended through the age of 104 years for any male who has sex with males  and did not get any or all doses earlier. Immunization is recommended for any person with an immunocompromised condition through the age of 30 years if he did not get any or all doses earlier. During the 3-dose series, the second dose should be obtained 4-8 weeks after the first dose. The third dose should be obtained 24 weeks after the first dose and 16 weeks after the second dose.  Zoster vaccine. One dose is recommended for adults aged 31 years or older unless certain conditions are present.  Measles, mumps, and rubella (MMR) vaccine. Adults born before [birth year] generally are considered immune to measles and mumps. Adults born in [birth year]  or later should have 1 or more doses of MMR vaccine unless there is a contraindication to the vaccine or there is laboratory evidence of immunity to each of the three diseases. A routine second dose of MMR vaccine should be obtained at least 28 days after the first dose for students attending postsecondary schools, health care workers, or international travelers. People who received inactivated measles vaccine or an unknown type of measles vaccine during 1963-1967 should receive 2 doses of MMR vaccine. People who received inactivated mumps vaccine or an unknown type of mumps vaccine before 1979 and are at high risk for mumps infection should consider immunization with 2 doses of MMR vaccine. Unvaccinated health care workers born before [birth year] who lack laboratory evidence of measles, mumps, or rubella immunity or laboratory confirmation of disease should consider measles and mumps immunization with 2 doses of MMR vaccine or rubella immunization with 1 dose of MMR vaccine.  Pneumococcal 13-valent conjugate (PCV13) vaccine. When indicated, a person who is uncertain of his immunization history and has no record of immunization should receive the PCV13 vaccine. All adults 9 years of age and older should receive this vaccine. An adult aged 69 years or older who has certain medical  conditions and has not been previously immunized should receive 1 dose of PCV13 vaccine. This PCV13 should be followed with a dose of pneumococcal polysaccharide (PPSV23) vaccine. Adults who are at high risk for pneumococcal disease should obtain the PPSV23 vaccine at least 8 weeks after the dose of PCV13 vaccine. Adults older than 69 years of age who have normal immune system function should obtain the PPSV23 vaccine dose at least 1 year after the dose of PCV13 vaccine.  Pneumococcal polysaccharide (PPSV23) vaccine. When PCV13 is also indicated, PCV13 should be obtained first. All adults aged 79 years and older should be immunized. An adult younger than age 43 years who has certain medical conditions should be immunized. Any person who resides in a nursing home or long-term care facility should be immunized. An adult smoker should be immunized. People with an immunocompromised condition and certain other conditions should receive both PCV13 and PPSV23 vaccines. People with human immunodeficiency virus (HIV) infection should be immunized as soon as possible after diagnosis. Immunization during chemotherapy or radiation therapy should be avoided. Routine use of PPSV23 vaccine is not recommended for American Indians, [place] Natives, or people younger than 65 years unless there are medical conditions that require PPSV23 vaccine. When indicated, people who have unknown immunization and have no record of immunization should receive PPSV23 vaccine. One-time revaccination 5 years after the first dose of PPSV23 is recommended for people aged 19-64 years who have chronic kidney failure, nephrotic syndrome, asplenia, or immunocompromised conditions. People who received 1-2 doses of PPSV23 before age 70 years should receive another dose of PPSV23 vaccine at age 79 years or later if at least 5 years have passed since the previous dose. Doses of PPSV23 are not needed for people immunized with PPSV23 at or after age 55  years.  Meningococcal vaccine. Adults with asplenia or persistent complement component deficiencies should receive 2 doses of quadrivalent meningococcal conjugate (MenACWY-D) vaccine. The doses should be obtained at least 2 months apart. Microbiologists working with certain meningococcal bacteria, [employer] recruits, people at risk during an outbreak, and people who travel to or live in countries with a high rate of meningitis should be immunized. A first-year college student up through age 64 years who is living in a residence hall should receive a  dose if he did not receive a dose on or after his 16th birthday. Adults who have certain high-risk conditions should receive one or more doses of vaccine.  Hepatitis A vaccine. Adults who wish to be protected from this disease, have chronic liver disease, work with hepatitis A-infected animals, work in hepatitis A research labs, or travel to or work in countries with a high rate of hepatitis A should be immunized. Adults who were previously unvaccinated and who anticipate close contact with an international adoptee during the first 60 days after arrival in the [place] from a country with a high rate of hepatitis A should be immunized.  Hepatitis B vaccine. Adults should be immunized if they wish to be protected from this disease, are under age 79 years and have diabetes, have chronic liver disease, have had more than one sex partner in the past 6 months, may be exposed to blood or other infectious body fluids, are household contacts or sex partners of hepatitis B positive people, are clients or workers in certain care facilities, or travel to or work in countries with a high rate of hepatitis B.  Haemophilus influenzae type b (Hib) vaccine. A previously unvaccinated person with asplenia or sickle cell disease or having a scheduled splenectomy should receive 1 dose of Hib vaccine. Regardless of previous immunization, a recipient of a hematopoietic stem cell  transplant should receive a 3-dose series 6-12 months after his successful transplant. Hib vaccine is not recommended for adults with HIV infection. Preventive Service / Frequency Ages 10 to 27  Blood pressure check.** / Every 3-5 years.  Lipid and cholesterol check.** / Every 5 years beginning at age 80.  Hepatitis C blood test.** / For any individual with known risks for hepatitis C.  Skin self-exam. / Monthly.  Influenza vaccine. / Every year.  Tetanus, diphtheria, and acellular pertussis (Tdap, Td) vaccine.** / Consult your health care provider. 1 dose of Td every 10 years.  Varicella vaccine.** / Consult your health care provider.  HPV vaccine. / 3 doses over 6 months, if 65 or younger.  Measles, mumps, rubella (MMR) vaccine.** / You need at least 1 dose of MMR if you were born in 1957 or later. You may also need a second dose.  Pneumococcal 13-valent conjugate (PCV13) vaccine.** / Consult your health care provider.  Pneumococcal polysaccharide (PPSV23) vaccine.** / 1 to 2 doses if you smoke cigarettes or if you have certain conditions.  Meningococcal vaccine.** / 1 dose if you are age 39 to 29 years and a Market researcher living in a residence hall, or have one of several medical conditions. You may also need additional booster doses.  Hepatitis A vaccine.** / Consult your health care provider.  Hepatitis B vaccine.** / Consult your health care provider.  Haemophilus influenzae type b (Hib) vaccine.** / Consult your health care provider. Ages 29 to 36  Blood pressure check.** / Every year.  Lipid and cholesterol check.** / Every 5 years beginning at age 61.  Lung cancer screening. / Every year if you are aged 39-80 years and have a 30-pack-year history of smoking and currently smoke or have quit within the past 15 years. Yearly screening is stopped once you have quit smoking for at least 15 years or develop a health problem that would prevent you from having  lung cancer treatment.  Fecal occult blood test (FOBT) of stool. / Every year beginning at age 103 and continuing until age 71. You may not have to do  this test if you get a colonoscopy every 10 years.  Flexible sigmoidoscopy** or colonoscopy.** / Every 5 years for a flexible sigmoidoscopy or every 10 years for a colonoscopy beginning at age 50 and continuing until age 75.  Hepatitis C blood test.** / For all people born from 1945 through 1965 and any individual with known risks for hepatitis C.  Skin self-exam. / Monthly.  Influenza vaccine. / Every year.  Tetanus, diphtheria, and acellular pertussis (Tdap/Td) vaccine.** / Consult your health care provider. 1 dose of Td every 10 years.  Varicella vaccine.** / Consult your health care provider.  Zoster vaccine.** / 1 dose for adults aged 60 years or older.  Measles, mumps, rubella (MMR) vaccine.** / You need at least 1 dose of MMR if you were born in 1959 or later. You may also need a second dose.  Pneumococcal 13-valent conjugate (PCV13) vaccine.** / Consult your health care provider.  Pneumococcal polysaccharide (PPSV23) vaccine.** / 1 to 2 doses if you smoke cigarettes or if you have certain conditions.  Meningococcal vaccine.** / Consult your health care provider.  Hepatitis A vaccine.** / Consult your health care provider.  Hepatitis B vaccine.** / Consult your health care provider.  Haemophilus influenzae type b (Hib) vaccine.** / Consult your health care provider. Ages 65 and over  Blood pressure check.** / Every year.  Lipid and cholesterol check.**/ Every 5 years beginning at age 20.  Lung cancer screening. / Every year if you are aged 55-80 years and have a 30-pack-year history of smoking and currently smoke or have quit within the past 15 years. Yearly screening is stopped once you have quit smoking for at least 15 years or develop a health problem that would prevent you from having lung cancer treatment.  Fecal  occult blood test (FOBT) of stool. / Every year beginning at age 50 and continuing until age 75. You may not have to do this test if you get a colonoscopy every 10 years.  Flexible sigmoidoscopy** or colonoscopy.** / Every 5 years for a flexible sigmoidoscopy or every 10 years for a colonoscopy beginning at age 50 and continuing until age 75.  Hepatitis C blood test.** / For all people born from 1947 through 1965 and any individual with known risks for hepatitis C.  Abdominal aortic aneurysm (AAA) screening.** / A one-time screening for ages 65 to 75 years who are current or former smokers.  Skin self-exam. / Monthly.  Influenza vaccine. / Every year.  Tetanus, diphtheria, and acellular pertussis (Tdap/Td) vaccine.** / 1 dose of Td every 10 years.  Varicella vaccine.** / Consult your health care provider.  Zoster vaccine.** / 1 dose for adults aged 60 years or older.  Pneumococcal 13-valent conjugate (PCV13) vaccine.** / 1 dose for all adults aged 65 years and older.  Pneumococcal polysaccharide (PPSV23) vaccine.** / 1 dose for all adults aged 65 years and older.  Meningococcal vaccine.** / Consult your health care provider.  Hepatitis A vaccine.** / Consult your health care provider.  Hepatitis B vaccine.** / Consult your health care provider.  Haemophilus influenzae type b (Hib) vaccine.** / Consult your health care provider. **Family history and personal history of risk and conditions may change your health care provider's recommendations.   This information is not intended to replace advice given to you by your health care provider. Make sure you discuss any questions you have with your health care provider.   Document Released: 10/25/2001 Document Revised: 09/19/2014 Document Reviewed: 01/24/2011 Elsevier Interactive Patient Education 2016   [place] blood pressure and weight are excellent! Since LDL increased from 58 to 84, please increase lovastatin from 15m  to 428m [facility] We will check PSA next year, sooner if you develop any urinary symptoms. If you experience 3 episodes of dizziness, confusion, time lapse, memory issues, or speech difficulty in the next 7 weeks- please call clinic and we will refer you to Neurology. Recommend regular follow-up in 6 months. NICE TO SEE YOU!

## 2018-06-12 NOTE — Assessment & Plan Note (Signed)
Recommend evaluation by Cardiology, he declined

## 2018-06-12 NOTE — Assessment & Plan Note (Signed)
Your blood pressure and weight are excellent! Since LDL increased from 58 to 84, please increase lovastatin from 20mg  to 40mg - new prescription sent into [facility] mail order. We will check PSA next year, sooner if you develop any urinary symptoms. If you experience 3 episodes of dizziness, confusion, time lapse, memory issues, or speech difficulty in the next 7 weeks- please call clinic and we will refer you to Neurology. Recommend regular follow-up in 6 months.

## 2018-06-12 NOTE — Assessment & Plan Note (Signed)
Last Thyroid panel WNL No change in Levothyroxine dosage

## 2018-06-28 ENCOUNTER — Telehealth: Payer: Self-pay | Admitting: Adult Health

## 2018-06-28 MED ORDER — LOVASTATIN 40 MG PO TABS: 40.0000 mg | ORAL_TABLET | Freq: Every day | ORAL | Status: DC

## 2018-06-28 MED ORDER — LOVASTATIN 40 MG PO TABS: 40.0000 mg | ORAL_TABLET | Freq: Every day | ORAL | 0 refills | Status: DC

## 2018-06-28 NOTE — Telephone Encounter (Signed)
Patient called states his request for : Lovastatin (MEVACOR) 40 MG tablet [[id number]]   Order Details  Dose: 40 mg Route: Oral Frequency: Daily at bedtime  Dispense Quantity: -- Refills: -- Fills remaining: --        Sig: Take 1 tablet (40 mg total) by mouth at bedtime.                      Ordering Provider: -- DEA #:  -- NPI:  --   Authorizing Provider: Esaw Grandchild, NP DEA #:  [id number] NPI:  [id number]   rdering User:  Esaw Grandchild, NP            Original Order:  lovastatin (MEVACOR) 20 MG tablet [[id number]]    Pharmacy:  [facility] ([place]), [place] DEA #:  --    Pharmacy Comments:  --         ---Per patient ( request was for 90dys supply & that it should have been sent to : [facility] [place], [place] [phone] (Phone) [phone] (Fax)   --forwarding new request to medical assistant.  --Dion Body

## 2018-07-24 ENCOUNTER — Telehealth: Payer: Self-pay | Admitting: Adult Health

## 2018-07-24 ENCOUNTER — Other Ambulatory Visit: Payer: Self-pay | Admitting: Adult Health

## 2018-07-24 NOTE — Telephone Encounter (Signed)
Patient is requesting a refill of his thyroid med, if approved please send to [facility]. Also, patient received his lovastatin med refill but he states it was only a 90 day supply with no refills and he wants to make sure that's accurate as he was advised to f/u in 6 months and will be out before his next appt. Please advise and contact patient at  [phone].

## 2018-07-25 NOTE — Telephone Encounter (Signed)
Discussed with pt that he should have refills on file at [facility] and that he needs to contact the pharmacy.  Advised pt that when he needs additional refills, he is to call his pharmacy and they will contact our office.  Pt expressed understanding and is agreeable.  Charyl Bigger, CMA

## 2018-08-20 ENCOUNTER — Other Ambulatory Visit: Payer: Self-pay | Admitting: Adult Health

## 2018-10-11 ENCOUNTER — Other Ambulatory Visit: Payer: Self-pay

## 2018-10-11 MED ORDER — LOVASTATIN 40 MG PO TABS: 40.0000 mg | ORAL_TABLET | Freq: Every day | ORAL | 0 refills | Status: DC

## 2018-10-11 NOTE — Telephone Encounter (Signed)
Pharmacy sent refill request for lovastatin, reviewed chart and sent in refills per office policy. MPulliam, CMA/RT(R)

## 2018-10-21 IMAGING — CT CT HEAD W/O CM
1 series · 16 of 30 positions shown, 20 images · non-contrast
Comparison: None.

CLINICAL DATA: Dizziness. Acute confusion. Recent TIAs. Blurred
vision.

EXAM:
CT HEAD WITHOUT CONTRAST
TECHNIQUE: Contiguous axial images were obtained from the base of the skull
through the vertex without intravenous contrast.

[Series 2: head w/(date) · axial · 0.44mm/px · z∈[-201,-51]mm · 16 of 34 slices shown, 20 images]
[im 2/34  brain]
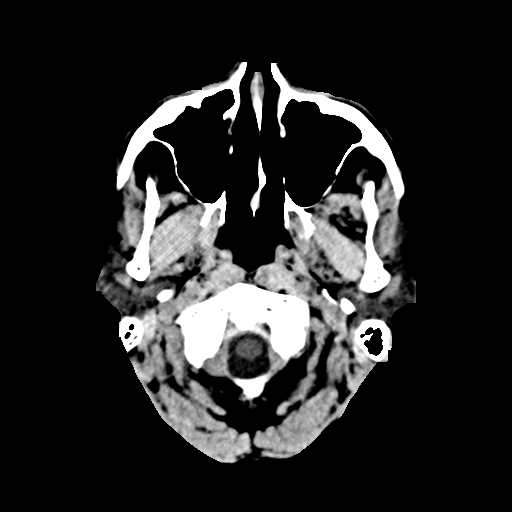
[im 2/34  bone]
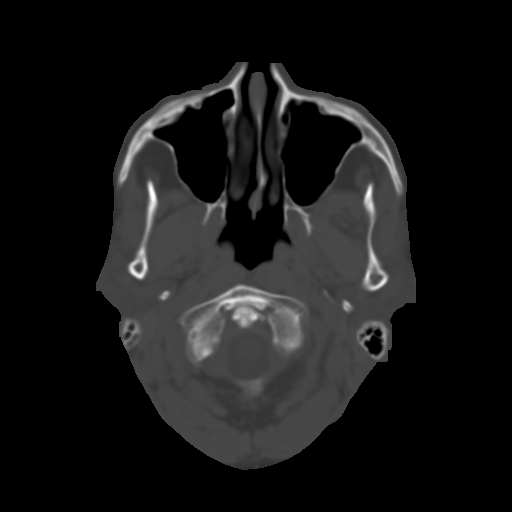
[im 4/34  brain]
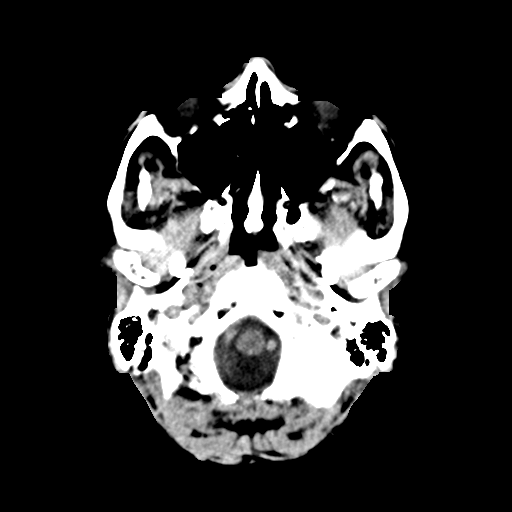
[im 6/34  brain]
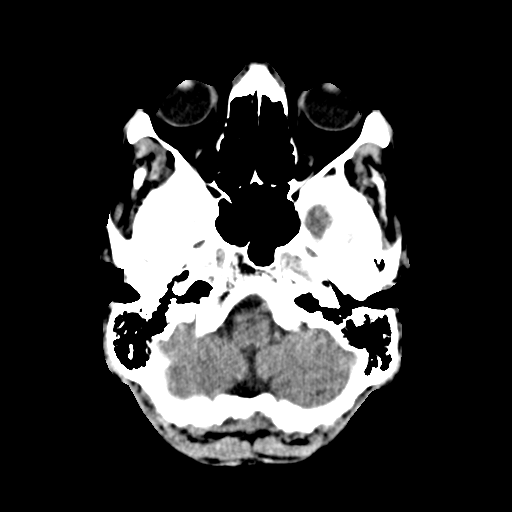
[im 8/34  brain]
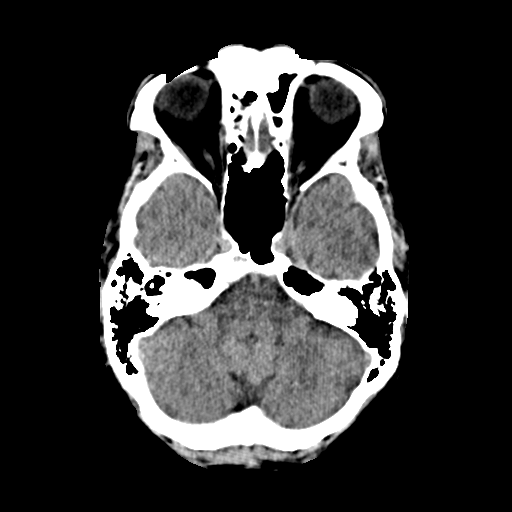
[im 10/34  brain]
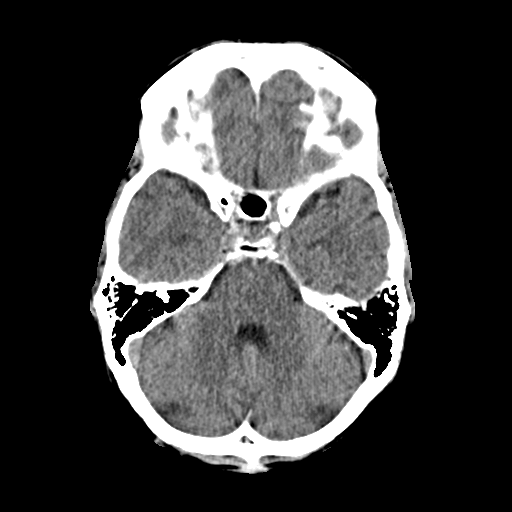
[im 10/34  bone]
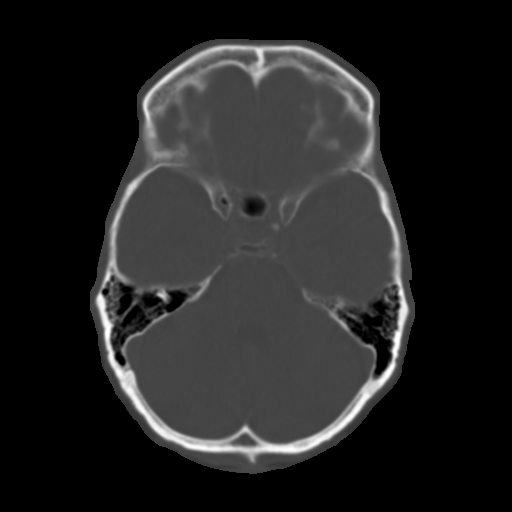
[im 12/34  brain]
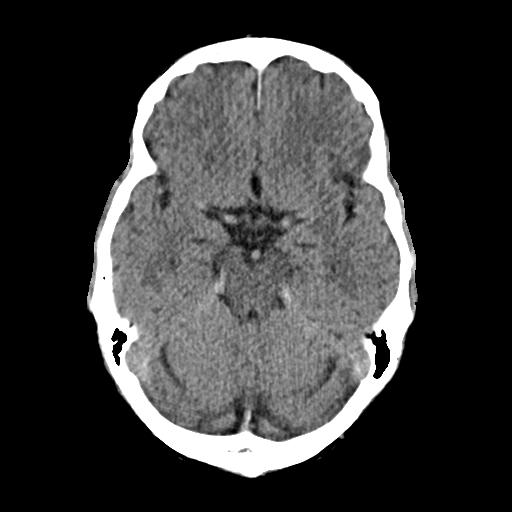
[im 14/34  brain]
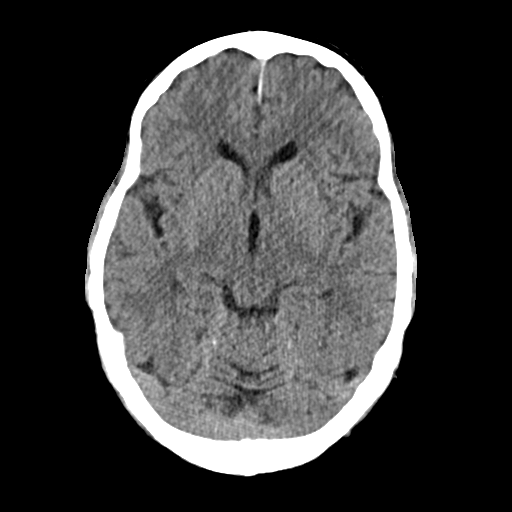
[im 16/34  brain]
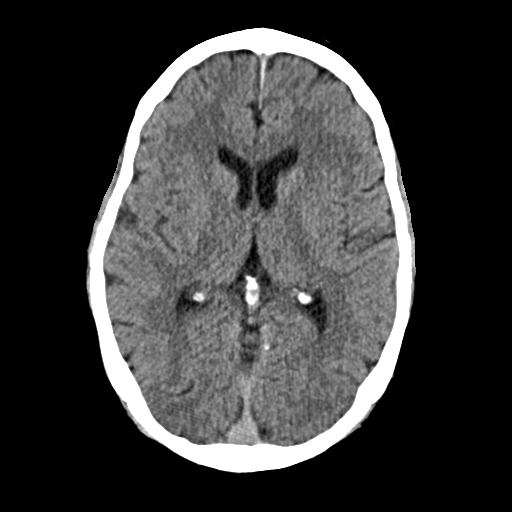
[im 18/34  brain]
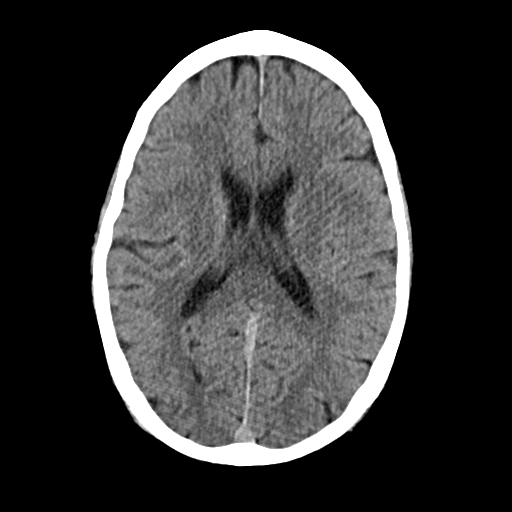
[im 18/34  bone]
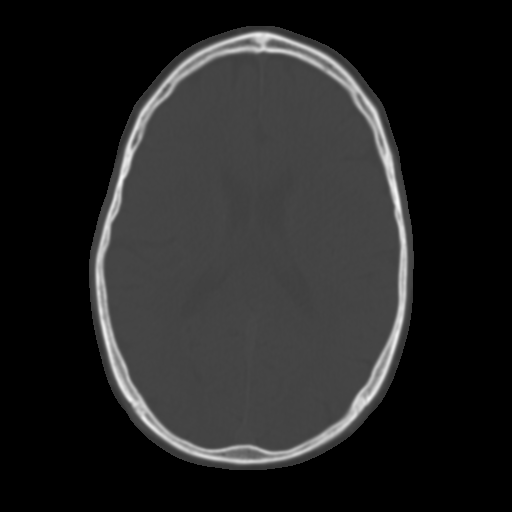
[im 20/34  brain]
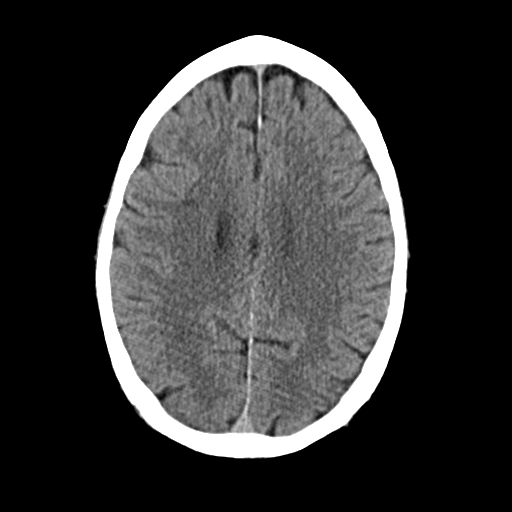
[im 22/34  brain]
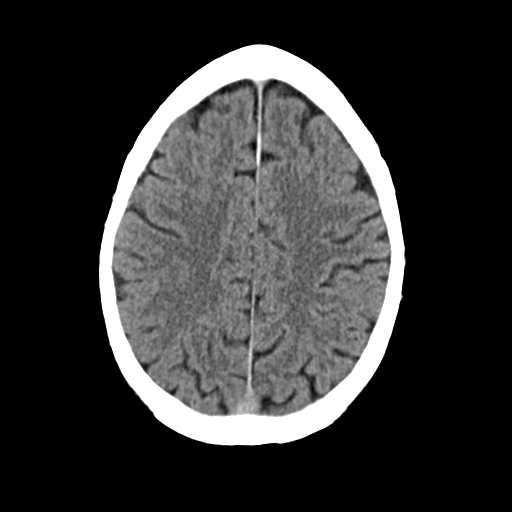
[im 24/34  brain]
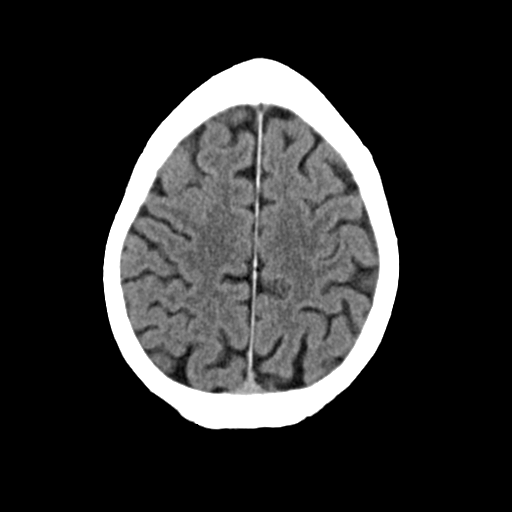
[im 26/34  brain]
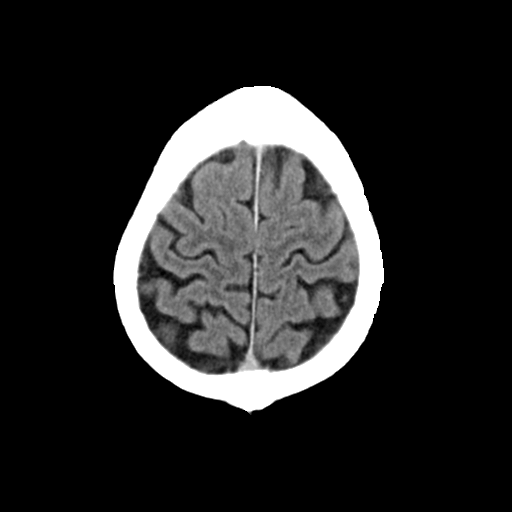
[im 26/34  bone]
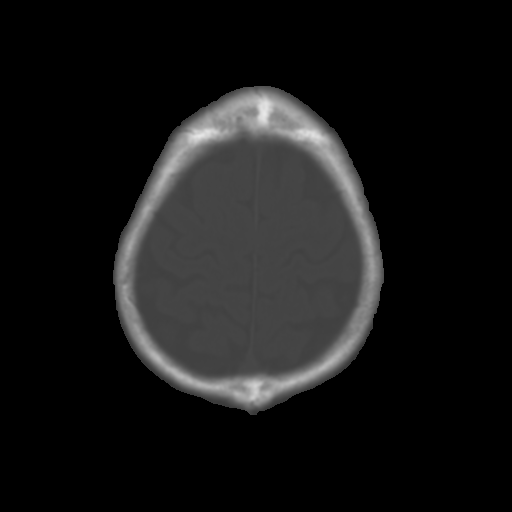
[im 28/34  brain]
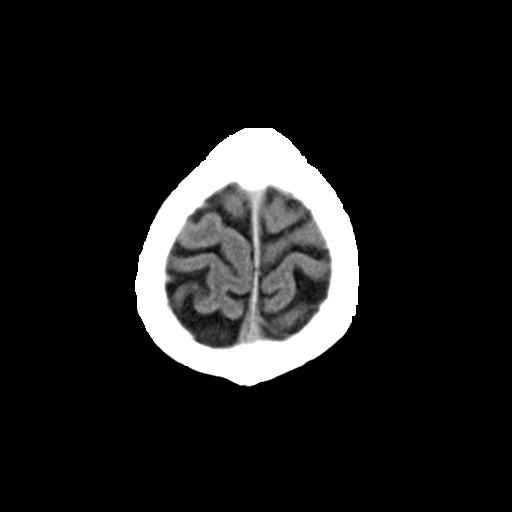
[im 30/34  brain]
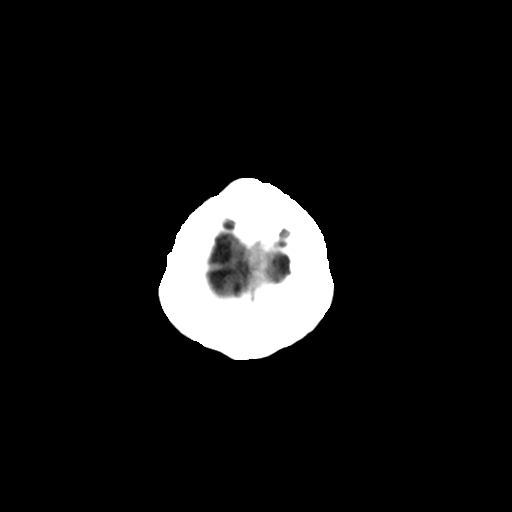
[im 32/34  brain]
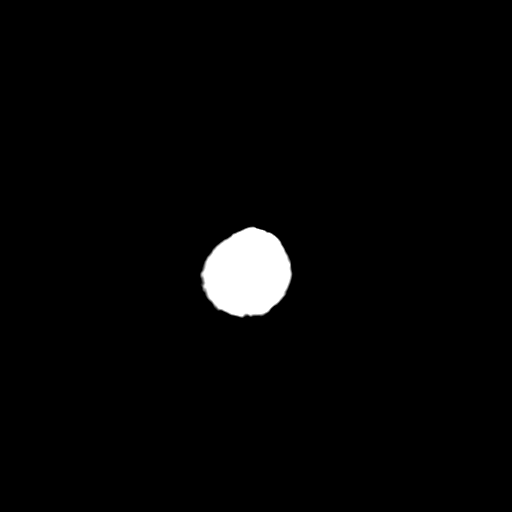

[16 of 30 positions shown; findings below may reference images not displayed]

FINDINGS: Brain: No acute infarct, hemorrhage, or mass lesion is present. The
ventricles are of normal size. No significant white matter disease
is present. The brainstem and cerebellum are normal. No significant
extra-axial fluid collections are present

Vascular: No hyperdense vessel or unexpected calcification.

Skull: Calvarium is intact. No focal lytic or blastic lesions are
present.

Sinuses/Orbits: The paranasal sinuses and mastoid air cells are
clear. Globes and orbits are within normal limits.
IMPRESSION: Negative CT of the head.

## 2018-11-19 NOTE — Progress Notes (Addendum)
Subjective:    Patient ID: Garnetta Buddy, male    DOB: [birth year], 68 y.o.   MRN: [id number]  HPI:06/12/2018 OV:  Mr. Oakland presents for CPE He reports numbness/tingling in upper extremity has only improved slightly since reduction in statin. He continues to remain quite active, he can climb >10 flights of stairs without being winded. He continues to dip, declined tobacco cessation. He continues to abstain from ETOH He estimates dizziness/blurred vision to be decreasing in frequency, "maybe once every other month now instead of monthly". Reviewed all recent labs The 10-year ASCVD risk score [name], et al., 2013) is: 15.8%   Values used to calculate the score:     Age: 19 years     Sex: Male     Is Non-Hispanic African American: No     Diabetic: No     Tobacco smoker: No     Systolic Blood Pressure: 578 mmHg     Is BP treated: No     HDL Cholesterol: 45 mg/dL     Total Cholesterol: 145 mg/dL  LDL- 84  He refused to dress in gown for physical  Healthcare Maintenance: Colonoscopy-UTD Immunizations-Needs Shingrax and Prevnar  11/21/2018 OV: Mr. Delaughter is here for regular f/u: hypothyroidism Last Thyroid panel 05/2019 was WNL He has been trying to increase water intake, will only "get a few sips in a day", but will drink 1-2 Propel Water/day He continues to abstain from tobacco/vape use He reports increase in GERD sx's Use d 14 day course of OTC Prilosec and OTC Nexium- has been sx free for 14 days He is concerned about COVID 19- long discussion regarding recent CDC guidelines  Patient Care Team    Relationship Specialty Notifications Start End  Esaw Grandchild, NP PCP - General Family Medicine  12/15/16   Gatha Mayer, MD Consulting Physician Gastroenterology  12/15/16     Patient Active Problem List   Diagnosis Date Noted  . Blurred vision 11/16/2017  . Acute confusion 11/16/2017  . Dizziness 11/16/2017  . Need for zoster vaccination 11/16/2017  .  Numbness and tingling in both hands 09/14/2017  . Family history of diabetes mellitus (DM) 06/21/2017  . Hyperlipidemia 05/22/2017  . Heart murmur 05/18/2017  . Healthcare maintenance 12/15/2016  . Family history of prostate cancer 04/21/2016  . Nevus of neck 04/21/2016  . Colon cancer screening 09/23/2014  . HLD (hyperlipidemia) 02/18/2014  . Hypothyroidism 02/18/2014  . BMI 25.0-25.9,adult 02/18/2014  . Tobacco abuse counseling 02/18/2014     Past Medical History:  Diagnosis Date  . HLD (hyperlipidemia)   . Hypothyroidism   . Irregular heartbeat    self-reported "slow heartbeat"     Past Surgical History:  Procedure Laterality Date  . NO PAST SURGERIES     as of 02/2014     Family History  Problem Relation Age of Onset  . Prostate cancer Father        Spread to bones  . Alcoholism Other        Mother and Father  . COPD Brother        Emphysema  . Diabetes Brother        neither required medicine  . Heart disease Other        Grandparents  . Early death Other        Mother, 48 (PNA); Father, 34 (cancer)  . Hypertension Brother   . Hyperlipidemia Brother   . Thyroid disease Other  Brother and grandfather  . Colon cancer Neg Hx      Social History   Substance and Sexual Activity  Drug Use No     Social History   Substance and Sexual Activity  Alcohol Use No  . Alcohol/week: 0.0 standard drinks     Social History   Tobacco Use  Smoking Status Never Smoker  Smokeless Tobacco Current User  . Types: Chew     Outpatient Encounter Medications as of 11/21/2018  Medication Sig  . ALOE VERA PO Take 1 capsule by mouth daily.  Marland Kitchen aspirin 81 MG tablet Take 81 mg by mouth daily.  Marland Kitchen lovastatin (MEVACOR) 40 MG tablet Take 1 tablet (40 mg total) by mouth at bedtime.  Marland Kitchen OVER THE COUNTER MEDICATION Take 200 mcg by mouth daily. SELENIUM SUPPLEMENT - OTC DAILY  . OVER THE COUNTER MEDICATION Take 2.5 g by mouth daily. SPIRULINA - OTC DAILY  . OVER THE  COUNTER MEDICATION Take 2 g by mouth daily. BARLEY GRASS - OTC DAILY  . OVER THE COUNTER MEDICATION Turmeric 450mg  2-3 times per week  . Probiotic Product (SOLUBLE FIBER/PROBIOTICS PO) Take 1 capsule by mouth 1 day or 1 dose.  . vitamin E 200 UNIT capsule Take 100 Units by mouth daily.   . [DISCONTINUED] levothyroxine (SYNTHROID, LEVOTHROID) 88 MCG tablet TAKE 1 TABLET EVERY DAY  . [DISCONTINUED] levothyroxine (SYNTHROID, LEVOTHROID) 88 MCG tablet Take 1 tablet (88 mcg total) by mouth daily.  . [DISCONTINUED] lovastatin (MEVACOR) 40 MG tablet Take 1 tablet (40 mg total) by mouth at bedtime.  . [DISCONTINUED] Multiple Vitamins-Minerals (MULTIVITAMIN GUMMIES ADULT PO) Take 1 each by mouth daily.   No facility-administered encounter medications on file as of 11/21/2018.     Allergies: Patient has no known allergies.  Body mass index is 24.83 kg/m.  Blood pressure 126/80, pulse 63, temperature 98.5 F (36.9 C), temperature source Oral, height 5\' 11"  (1.803 m), weight 178 lb (80.7 kg), SpO2 98 %.  Review of Systems  Constitutional: Positive for fatigue. Negative for activity change, appetite change, chills, diaphoresis, fever and unexpected weight change.  HENT: Negative for congestion.   Eyes: Negative for visual disturbance.  Respiratory: Negative for cough, chest tightness, shortness of breath, wheezing and stridor.   Cardiovascular: Negative for chest pain, palpitations and leg swelling.  Gastrointestinal: Negative for abdominal distention, anal bleeding, blood in stool, constipation, diarrhea, nausea and vomiting.  Endocrine: Negative for cold intolerance, heat intolerance, polydipsia and polyuria.  Genitourinary: Negative for difficulty urinating and flank pain.  Musculoskeletal: Negative for arthralgias, back pain, gait problem, joint swelling, myalgias, neck pain and neck stiffness.  Skin: Negative for color change, pallor, rash and wound.  Neurological: Negative for dizziness and  headaches.  Hematological: Does not bruise/bleed easily.  Psychiatric/Behavioral: Negative for behavioral problems, confusion, decreased concentration, dysphoric mood, hallucinations, self-injury, sleep disturbance and suicidal ideas. The patient is not nervous/anxious and is not hyperactive.        Objective:   Physical Exam Vitals signs and nursing note reviewed.  Constitutional:      General: He is not in acute distress.    Appearance: He is well-developed. He is not diaphoretic.  HENT:     Head: Normocephalic and atraumatic.     Right Ear: External ear normal. No decreased hearing noted. Tympanic membrane is not erythematous or bulging.     Left Ear: External ear normal. No decreased hearing noted. Tympanic membrane is not erythematous or bulging.     Nose: Nose  normal. No mucosal edema or rhinorrhea.     Right Sinus: No maxillary sinus tenderness or frontal sinus tenderness.     Left Sinus: No maxillary sinus tenderness or frontal sinus tenderness.     Mouth/Throat:     Dentition: Abnormal dentition.  Eyes:     Conjunctiva/sclera: Conjunctivae normal.     Pupils: Pupils are equal, round, and reactive to light.  Neck:     Musculoskeletal: Normal range of motion and neck supple.  Cardiovascular:     Rate and Rhythm: Normal rate and regular rhythm.     Heart sounds: Murmur present.  Pulmonary:     Effort: Pulmonary effort is normal. No respiratory distress.     Breath sounds: Normal breath sounds. No stridor. No wheezing or rales.  Chest:     Chest wall: No tenderness.  Abdominal:     General: Bowel sounds are normal. There is no distension.     Palpations: Abdomen is soft. There is no mass.     Tenderness: There is no abdominal tenderness. There is no guarding or rebound.     Hernia: No hernia is present.  Musculoskeletal: Normal range of motion.  Lymphadenopathy:     Cervical: No cervical adenopathy.  Skin:    General: Skin is warm and dry.     Capillary Refill:  Capillary refill takes less than 2 seconds.     Coloration: Skin is not pale.     Findings: No erythema or rash.  Neurological:     Mental Status: He is alert and oriented to person, place, and time.     Cranial Nerves: No cranial nerve deficit.     Coordination: Coordination normal.     Deep Tendon Reflexes: Reflexes normal.  Psychiatric:        Behavior: Behavior normal.        Thought Content: Thought content normal.        Judgment: Judgment normal.           Assessment & Plan:   1. Other specified hypothyroidism   2. Heart murmur   3. Healthcare maintenance   4. Other hyperlipidemia     Healthcare maintenance 6 months of medications sent into Mail order pharmacy. Increase water intake, strive for at least half your body weight in oz - 80 oz/day Continue to follow heart healthy diet and remain as active as possible. Follow-up in 6 months for complete physical, fasting labs the week prior. If you develop acid reflux symptoms, okay to use OTC Nexium.  Heart murmur Present- pt denies acute cardiac sx's Rec ECHO- pt declined   HLD (hyperlipidemia) Denies myalgia's on Lovastatin 40mg  QD  Hypothyroidism Denies increase in fatigue     FOLLOW-UP:  Return in about 6 months (around 05/24/2019) for CPE, Fasting Labs.

## 2018-11-21 ENCOUNTER — Encounter: Payer: Self-pay | Admitting: Adult Health

## 2018-11-21 ENCOUNTER — Ambulatory Visit (INDEPENDENT_AMBULATORY_CARE_PROVIDER_SITE_OTHER): Payer: Medicare HMO | Admitting: Adult Health

## 2018-11-21 ENCOUNTER — Other Ambulatory Visit: Payer: Self-pay

## 2018-11-21 VITALS — BP 126/80 | HR 63 | Temp 98.5°F | Ht 71.0 in | Wt 178.0 lb

## 2018-11-21 DIAGNOSIS — R011 Cardiac murmur, unspecified: Secondary | ICD-10-CM | POA: Diagnosis not present

## 2018-11-21 DIAGNOSIS — Z0000 Encounter for general adult medical examination without abnormal findings: Secondary | ICD-10-CM

## 2018-11-21 DIAGNOSIS — E7849 Other hyperlipidemia: Secondary | ICD-10-CM

## 2018-11-21 DIAGNOSIS — E038 Other specified hypothyroidism: Secondary | ICD-10-CM

## 2018-11-21 MED ORDER — LEVOTHYROXINE SODIUM 88 MCG PO TABS: 88.0000 ug | ORAL_TABLET | Freq: Every day | ORAL | 1 refills | Status: DC

## 2018-11-21 MED ORDER — LOVASTATIN 40 MG PO TABS: 40.0000 mg | ORAL_TABLET | Freq: Every day | ORAL | 1 refills | Status: DC

## 2018-11-21 NOTE — Assessment & Plan Note (Signed)
Denies myalgia's on Lovastatin 40mg  QD

## 2018-11-21 NOTE — Assessment & Plan Note (Signed)
6 months of medications sent into Mail order pharmacy. Increase water intake, strive for at least half your body weight in oz - 80 oz/day Continue to follow heart healthy diet and remain as active as possible. Follow-up in 6 months for complete physical, fasting labs the week prior. If you develop acid reflux symptoms, okay to use OTC Nexium.

## 2018-11-21 NOTE — Patient Instructions (Addendum)
Cholesterol Cholesterol is a white, waxy, fat-like substance that is needed by the human body in small amounts. The liver makes all the cholesterol we need. Cholesterol is carried from the liver by the blood through the blood vessels. Deposits of cholesterol (plaques) may build up on blood vessel (artery) walls. Plaques make the arteries narrower and stiffer. Cholesterol plaques increase the risk for heart attack and stroke. You cannot feel your cholesterol level even if it is very high. The only way to know that it is high is to have a blood test. Once you know your cholesterol levels, you should keep a record of the test results. Work with your health care provider to keep your levels in the desired range. What do the results mean?  Total cholesterol is a rough measure of all the cholesterol in your blood.  LDL (low-density lipoprotein) is the bad cholesterol. This is the type that causes plaque to build up on the artery walls. You want this level to be low.  HDL (high-density lipoprotein) is the good cholesterol because it cleans the arteries and carries the LDL away. You want this level to be high.  Triglycerides are fat that the body can either burn for energy or store. High levels are closely linked to heart disease. What are the desired levels of cholesterol?  Total cholesterol below 200.  LDL below 100 for people who are at risk, below 70 for people at very high risk.  HDL above 40 is good. A level of 60 or higher is considered to be protective against heart disease.  Triglycerides below 150. How can I lower my cholesterol? Diet Follow your diet program as told by your health care provider.  Choose fish or white meat chicken and [place], roasted or baked. Limit fatty cuts of red meat, fried foods, and processed meats, such as sausage and lunch meats.  Eat lots of fresh fruits and vegetables.  Choose whole grains, beans, pasta, potatoes, and cereals.  Choose olive oil, corn  oil, or canola oil, and use only small amounts.  Avoid butter, mayonnaise, shortening, or palm kernel oils.  Avoid foods with trans fats.  Drink skim or nonfat milk and eat low-fat or nonfat yogurt and cheeses. Avoid whole milk, cream, ice cream, egg yolks, and full-fat cheeses.  Healthier desserts include angel food cake, ginger snaps, animal crackers, hard candy, popsicles, and low-fat or nonfat frozen yogurt. Avoid pastries, cakes, pies, and cookies.  Exercise  Follow your exercise program as told by your health care provider. A regular program: ? Helps to decrease LDL and raise HDL. ? Helps with weight control.  Do things that increase your activity level, such as gardening, walking, and taking the stairs.  Ask your health care provider about ways that you can be more active in your daily life. Medicine  Take over-the-counter and prescription medicines only as told by your health care provider. ? Medicine may be prescribed by your health care provider to help lower cholesterol and decrease the risk for heart disease. This is usually done if diet and exercise have failed to bring down cholesterol levels. ? If you have several risk factors, you may need medicine even if your levels are normal. This information is not intended to replace advice given to you by your health care provider. Make sure you discuss any questions you have with your health care provider. Document Released: 05/24/2001 Document Revised: 03/26/2016 Document Reviewed: 02/27/2016 Elsevier Interactive Patient Education  2019 [employer].  6 months of  medications sent into Mail order pharmacy. Increase water intake, strive for at least half your body weight in oz - 80 oz/day Continue to follow heart healthy diet and remain as active as possible. Follow-up in 6 months for complete physical, fasting labs the week prior. If you develop acid reflux symptoms, okay to use OTC Nexium. GREAT TO SEE YOU!

## 2018-11-21 NOTE — Assessment & Plan Note (Addendum)
Present- pt denies acute cardiac sx's Rec ECHO- pt declined

## 2018-11-21 NOTE — Assessment & Plan Note (Signed)
Denies increase in fatigue

## 2018-11-21 NOTE — Assessment & Plan Note (Signed)
>>  ASSESSMENT AND PLAN FOR HLD (HYPERLIPIDEMIA) WRITTEN ON 11/21/2018 11:20 AM BY DANFORD, KATY D, NP  Denies myalgia's on Lovastatin 40mg  QD

## 2018-12-03 ENCOUNTER — Ambulatory Visit: Payer: Medicare HMO | Admitting: Adult Health

## 2019-05-22 ENCOUNTER — Telehealth: Payer: Self-pay | Admitting: Adult Health

## 2019-05-22 MED ORDER — LEVOTHYROXINE SODIUM 88 MCG PO TABS: 88.0000 ug | ORAL_TABLET | Freq: Every day | ORAL | 0 refills | Status: DC

## 2019-05-22 NOTE — Telephone Encounter (Signed)
Patient called to request refill on :   levothyroxine (SYNTHROID, LEVOTHROID) 88 MCG tablet [id number]   Order Details Dose: 88 mcg Route: Oral Frequency: Daily  Dispense Quantity: 90 tablet Refills: 1 Fills remaining: --        Sig: Take 1 tablet (88 mcg total) by mouth daily.           Pt uses :   [place], [place] [phone] (Phone) [phone] (Fax)   Pt has made appt for Telehealth for Rx refill-- glh

## 2019-05-22 NOTE — Progress Notes (Signed)
Virtual Visit via Telephone Note  I connected with Carlos [name] on 05/23/2019 at  1:45 PM EDT by telephone and verified that I am speaking with the correct person using two identifiers.  Location: Patient: Home  Provider: In Clinic   I discussed the limitations, risks, security and privacy concerns of performing an evaluation and management service by telephone and the availability of in person appointments. I also discussed with the patient that there may be a patient responsible charge related to this service. The patient expressed understanding and agreed to proceed.   History of Present Illness: 06/12/2018 OV:  Carlos [name] presents for CPE He reports numbness/tingling in upper extremity has only improved slightly since reduction in statin. He continues to remain quite active, he can climb >10 flights of stairs without being winded. He continues to dip, declined tobacco cessation. He continues to abstain from ETOH He estimates dizziness/blurred vision to be decreasing in frequency, "maybe once every other month now instead of monthly". Reviewed all recent labs The 10-year ASCVD risk score [name], et al., 2013) is: 14.6%   Values used to calculate the score:     Age: 23 years     Sex: Male     Is Non-Hispanic African American: No     Diabetic: No     Tobacco smoker: No     Systolic Blood Pressure: 123XX123 mmHg     Is BP treated: No     HDL Cholesterol: 45 mg/dL     Total Cholesterol: 145 mg/dL  LDL- 84  He refused to dress in gown for physical  Healthcare Maintenance: Colonoscopy-UTD Immunizations-Needs Shingrax and Prevnar  11/21/2018 OV: Carlos [name] is here for regular f/u: hypothyroidism Last Thyroid panel 05/2019 was WNL He has been trying to increase water intake, will only "get a few sips in a day", but will drink 1-2 Propel Water/day He continues to abstain from tobacco/vape use He reports increase in GERD sx's Use d 14 day course of OTC Prilosec and  OTC Nexium- has been sx free for 14 days He is concerned about COVID 19- long discussion regarding recent CDC guidelines  05/23/2019 TeleMedicine Visit: Carlos [name] calls in today for regular f/u: HLD, GERD, Hypothyroidism He denies CP/chest tightness with exertion He denies palpitations He denies HA/dizziness He continues to experience GERD and when he uses esomeprazole PRN- has poor sx control Recommend referral to GI for eval/EGD- he declined Discussed risks associated with long-term PPI use, encouraged diet modifications He continues to use smokeless tobacco, declined tobacco cessation today  Patient Care Team    Relationship Specialty Notifications Start End  Mina Marble D, NP PCP - General Family Medicine  12/15/16   Gatha Mayer, MD Consulting Physician Gastroenterology  12/15/16     Patient Active Problem List   Diagnosis Date Noted  . Blurred vision 11/16/2017  . Acute confusion 11/16/2017  . Dizziness 11/16/2017  . Need for zoster vaccination 11/16/2017  . Numbness and tingling in both hands 09/14/2017  . Family history of diabetes mellitus (DM) 06/21/2017  . Hyperlipidemia 05/22/2017  . Heart murmur 05/18/2017  . Healthcare maintenance 12/15/2016  . Family history of prostate cancer 04/21/2016  . Nevus of neck 04/21/2016  . Colon cancer screening 09/23/2014  . HLD (hyperlipidemia) 02/18/2014  . Hypothyroidism 02/18/2014  . BMI 25.0-25.9,adult 02/18/2014  . Tobacco abuse counseling 02/18/2014     Past Medical History:  Diagnosis Date  . HLD (hyperlipidemia)   . Hypothyroidism   . Irregular  heartbeat    self-reported "slow heartbeat"     Past Surgical History:  Procedure Laterality Date  . NO PAST SURGERIES     as of 02/2014     Family History  Problem Relation Age of Onset  . Prostate cancer Father        Spread to bones  . Alcoholism Other        Mother and Father  . COPD Brother        Emphysema  . Diabetes Brother        neither required  medicine  . Heart disease Other        Grandparents  . Early death Other        Mother, 15 (PNA); Father, 21 (cancer)  . Hypertension Brother   . Hyperlipidemia Brother   . Thyroid disease Other        Brother and grandfather  . Colon cancer Neg Hx      Social History   Substance and Sexual Activity  Drug Use No     Social History   Substance and Sexual Activity  Alcohol Use No  . Alcohol/week: 0.0 standard drinks     Social History   Tobacco Use  Smoking Status Never Smoker  Smokeless Tobacco Current User  . Types: Chew     Outpatient Encounter Medications as of 05/23/2019  Medication Sig  . ALOE VERA PO Take 1 capsule by mouth daily.  Marland Kitchen aspirin 81 MG tablet Take 81 mg by mouth daily.  Marland Kitchen levothyroxine (SYNTHROID) 88 MCG tablet Take 1 tablet (88 mcg total) by mouth daily.  Marland Kitchen lovastatin (MEVACOR) 40 MG tablet Take 1 tablet (40 mg total) by mouth at bedtime.  Marland Kitchen OVER THE COUNTER MEDICATION Take 200 mcg by mouth daily. SELENIUM SUPPLEMENT - OTC DAILY  . OVER THE COUNTER MEDICATION Take 2.5 g by mouth daily. SPIRULINA - OTC DAILY  . OVER THE COUNTER MEDICATION Take 2 g by mouth daily. BARLEY GRASS - OTC DAILY  . OVER THE COUNTER MEDICATION Turmeric 450mg  2-3 times per week  . Probiotic Product (SOLUBLE FIBER/PROBIOTICS PO) Take 1 capsule by mouth 1 day or 1 dose.  . vitamin E 200 UNIT capsule Take 100 Units by mouth daily.   Marland Kitchen esomeprazole (NEXIUM) 20 MG capsule Take 1 capsule (20 mg total) by mouth daily at 12 noon.  . [DISCONTINUED] Multiple Vitamins-Minerals (MULTIVITAMIN GUMMIES ADULT PO) Take 1 each by mouth daily.   No facility-administered encounter medications on file as of 05/23/2019.     Allergies: Patient has no known allergies.  Body mass index is 24.55 kg/m.  Temperature 97.9 F (36.6 C), temperature source Oral, height 5\' 11"  (1.803 m), weight 176 lb (79.8 kg). Review of Systems: General:   Denies fever, chills, unexplained weight loss.   Optho/Auditory:   Denies visual changes, blurred vision/LOV Respiratory:   Denies SOB, DOE more than baseline levels.  Cardiovascular:   Denies chest pain, palpitations, new onset peripheral edema  Gastrointestinal:   Denies nausea, vomiting, diarrhea.  Genitourinary: Denies dysuria, freq/ urgency, flank pain or discharge from genitals.  Endocrine:     Denies hot or cold intolerance, polyuria, polydipsia. Musculoskeletal:   Denies unexplained myalgias, joint swelling, unexplained arthralgias, gait problems.  Skin:  Denies rash, suspicious lesions Neurological:     Denies dizziness, unexplained weakness, numbness  Psychiatric/Behavioral:   Denies mood changes, suicidal or homicidal ideations, hallucinations This patient does not have sx concerning for COVID-19 Infection (ie; fever, chills, cough, new or  worsening shortness of breath).  Observations/Objective: No acute distress noted during the telephone conversation  Assessment and Plan: Generic Esomeprazole sent in- discussed risks associated with long-term PPI use-declined referral to GI for eval/EGD Continue all other medications as directed Will call after lab results are available Increase water intake, eliminate soda intake Follow heart healthy diet Remain as active as possible  Follow Up Instructions: 1 week fasting labs 6 weeks CPE   I discussed the assessment and treatment plan with the patient. The patient was provided an opportunity to ask questions and all were answered. The patient agreed with the plan and demonstrated an understanding of the instructions.   The patient was advised to call back or seek an in-person evaluation if the symptoms worsen or if the condition fails to improve as anticipated.  I provided 14 minutes of non-face-to-face time during this encounter.   Esaw Grandchild, NP

## 2019-05-23 ENCOUNTER — Other Ambulatory Visit: Payer: Self-pay

## 2019-05-23 ENCOUNTER — Ambulatory Visit (INDEPENDENT_AMBULATORY_CARE_PROVIDER_SITE_OTHER): Payer: Medicare HMO | Admitting: Adult Health

## 2019-05-23 ENCOUNTER — Encounter: Payer: Self-pay | Admitting: Adult Health

## 2019-05-23 DIAGNOSIS — Z0000 Encounter for general adult medical examination without abnormal findings: Secondary | ICD-10-CM

## 2019-05-23 MED ORDER — ESOMEPRAZOLE MAGNESIUM 20 MG PO CPDR: 20.0000 mg | DELAYED_RELEASE_CAPSULE | Freq: Every day | ORAL | 3 refills | Status: DC

## 2019-05-23 NOTE — Assessment & Plan Note (Signed)
Assessment and Plan: Generic Esomeprazole sent in- discussed risks associated with long-term PPI use-declined referral to GI for eval/EGD Continue all other medications as directed Will call after lab results are available Increase water intake, eliminate soda intake Follow heart healthy diet Remain as active as possible  Follow Up Instructions: 1 week fasting labs 6 weeks CPE   I discussed the assessment and treatment plan with the patient. The patient was provided an opportunity to ask questions and all were answered. The patient agreed with the plan and demonstrated an understanding of the instructions.   The patient was advised to call back or seek an in-person evaluation if the symptoms worsen or if the condition fails to improve as anticipated.

## 2019-05-28 ENCOUNTER — Other Ambulatory Visit: Payer: Medicare HMO

## 2019-05-28 ENCOUNTER — Other Ambulatory Visit: Payer: Self-pay

## 2019-05-28 DIAGNOSIS — E7849 Other hyperlipidemia: Secondary | ICD-10-CM

## 2019-05-28 DIAGNOSIS — Z0000 Encounter for general adult medical examination without abnormal findings: Secondary | ICD-10-CM

## 2019-05-28 DIAGNOSIS — E785 Hyperlipidemia, unspecified: Secondary | ICD-10-CM | POA: Diagnosis not present

## 2019-05-28 DIAGNOSIS — E039 Hypothyroidism, unspecified: Secondary | ICD-10-CM

## 2019-05-28 DIAGNOSIS — Z833 Family history of diabetes mellitus: Secondary | ICD-10-CM | POA: Diagnosis not present

## 2019-05-28 DIAGNOSIS — Z8042 Family history of malignant neoplasm of prostate: Secondary | ICD-10-CM

## 2019-05-28 DIAGNOSIS — H538 Other visual disturbances: Secondary | ICD-10-CM | POA: Diagnosis not present

## 2019-05-28 DIAGNOSIS — R200 Anesthesia of skin: Secondary | ICD-10-CM | POA: Diagnosis not present

## 2019-05-28 DIAGNOSIS — R410 Disorientation, unspecified: Secondary | ICD-10-CM | POA: Diagnosis not present

## 2019-05-28 NOTE — Addendum Note (Signed)
Addended by: Fonnie Mu on: 05/28/2019 11:06 AM   Modules accepted: Orders

## 2019-05-29 ENCOUNTER — Other Ambulatory Visit: Payer: Self-pay | Admitting: Adult Health

## 2019-05-29 LAB — LIPID PANEL
Chol/HDL Ratio: 3.4 ratio (ref 0.0–5.0)
Cholesterol, Total: 139 mg/dL (ref 100–199)
HDL: 41 mg/dL (ref >39)
LDL Chol Calc (NIH): 79 mg/dL (ref 0–99)
Triglycerides: 100 mg/dL (ref 0–149)
VLDL Cholesterol Cal: 19 mg/dL (ref 5–40)

## 2019-05-29 LAB — CBC WITH DIFFERENTIAL/PLATELET
Basophils Absolute: 0.1 10*3/uL (ref 0.0–0.2)
Basos: 1 %
EOS (ABSOLUTE): 0.1 10*3/uL (ref 0.0–0.4)
Eos: 2 %
Hematocrit: 45.6 % (ref 37.5–51.0)
Hemoglobin: 15.4 g/dL (ref 13.0–17.7)
Immature Grans (Abs): 0 10*3/uL (ref 0.0–0.1)
Immature Granulocytes: 0 %
Lymphocytes Absolute: 1.4 10*3/uL (ref 0.7–3.1)
Lymphs: 29 %
MCH: 30.4 pg (ref 26.6–33.0)
MCHC: 33.8 g/dL (ref 31.5–35.7)
MCV: 90 fL (ref 79–97)
Monocytes Absolute: 0.6 10*3/uL (ref 0.1–0.9)
Monocytes: 12 %
Neutrophils Absolute: 2.7 10*3/uL (ref 1.4–7.0)
Neutrophils: 56 %
Platelets: 175 10*3/uL (ref 150–450)
RBC: 5.06 x10E6/uL (ref 4.14–5.80)
RDW: 12.3 % (ref 11.6–15.4)
WBC: 4.8 10*3/uL (ref 3.4–10.8)

## 2019-05-29 LAB — TSH: TSH: 5.4 u[IU]/mL — ABNORMAL HIGH (ref 0.450–4.500)

## 2019-05-29 LAB — COMPREHENSIVE METABOLIC PANEL
ALT: 14 IU/L (ref 0–44)
AST: 17 IU/L (ref 0–40)
Albumin/Globulin Ratio: 2.6 — ABNORMAL HIGH (ref 1.2–2.2)
Albumin: 4.6 g/dL (ref 3.8–4.8)
Alkaline Phosphatase: 71 IU/L (ref 39–117)
BUN/Creatinine Ratio: 15 (ref 10–24)
BUN: 17 mg/dL (ref 8–27)
Bilirubin Total: 0.9 mg/dL (ref 0.0–1.2)
CO2: 23 mmol/L (ref 20–29)
Calcium: 9.6 mg/dL (ref 8.6–10.2)
Chloride: 104 mmol/L (ref 96–106)
Creatinine, Ser: 1.13 mg/dL (ref 0.76–1.27)
GFR calc Af Amer: 76 mL/min/{1.73_m2} (ref >59)
GFR calc non Af Amer: 65 mL/min/{1.73_m2} (ref >59)
Globulin, Total: 1.8 g/dL (ref 1.5–4.5)
Glucose: 93 mg/dL (ref 65–99)
Potassium: 4.6 mmol/L (ref 3.5–5.2)
Sodium: 141 mmol/L (ref 134–144)
Total Protein: 6.4 g/dL (ref 6.0–8.5)

## 2019-05-29 LAB — HEMOGLOBIN A1C
Est. average glucose Bld gHb Est-mCnc: 114 mg/dL
Hgb A1c MFr Bld: 5.6 % (ref 4.8–5.6)

## 2019-05-29 LAB — PSA: Prostate Specific Ag, Serum: 0.9 ng/mL (ref 0.0–4.0)

## 2019-05-29 MED ORDER — LEVOTHYROXINE SODIUM 100 MCG PO TABS: 100.0000 ug | ORAL_TABLET | Freq: Every day | ORAL | 0 refills | Status: DC

## 2019-06-19 ENCOUNTER — Other Ambulatory Visit: Payer: Self-pay | Admitting: Adult Health

## 2019-06-19 MED ORDER — LOVASTATIN 40 MG PO TABS: 40.0000 mg | ORAL_TABLET | Freq: Every day | ORAL | 1 refills | Status: DC

## 2019-06-19 NOTE — Telephone Encounter (Signed)
Patient called requested refill on: ( Patient request 3 month supply)  lovastatin (MEVACOR) 40 MG tablet [id number]   Order Details Dose: 40 mg Route: Oral Frequency: Daily at bedtime  Dispense Quantity: 90 tablet Refills: 1 Fills remaining: --        Sig: Take 1 tablet (40 mg total) by mouth at bedtime.      Forwarding message to medical asst that if approved send refill order to:   [facility] [place], [place] [phone] (Phone) [phone] (Fax)   --Dion Body

## 2019-06-19 NOTE — Telephone Encounter (Signed)
Lovastatin refilled per last lab result.  Patient to still follow up as directed.

## 2019-07-03 DIAGNOSIS — H5213 Myopia, bilateral: Secondary | ICD-10-CM | POA: Diagnosis not present

## 2019-07-03 DIAGNOSIS — H524 Presbyopia: Secondary | ICD-10-CM | POA: Diagnosis not present

## 2019-07-03 DIAGNOSIS — H52209 Unspecified astigmatism, unspecified eye: Secondary | ICD-10-CM | POA: Diagnosis not present

## 2019-07-09 ENCOUNTER — Ambulatory Visit: Payer: Medicare HMO | Admitting: Adult Health

## 2019-07-09 ENCOUNTER — Other Ambulatory Visit: Payer: Self-pay

## 2019-07-09 NOTE — Progress Notes (Signed)
NO SHOW

## 2019-07-10 ENCOUNTER — Telehealth: Payer: Self-pay | Admitting: Adult Health

## 2019-07-10 NOTE — Telephone Encounter (Signed)
We had to reschedule this patient on short notice and tried to get him yesterday in earlier for a televisit but could not reach him. He called back and we rescheduled him to [facility] next available which is five weeks from now. He states he will be out of his thyroid med by then and is hoping Carlos [name] can send a refill to [facility]. Please advise

## 2019-07-10 NOTE — Telephone Encounter (Signed)
Pt needs repeat TSH prior to any further refills since changes were made with last RX.  Attempted to contact pt however his VM has not been set up.  Unable to leave message.  Charyl Bigger, CMA

## 2019-07-18 ENCOUNTER — Other Ambulatory Visit: Payer: Medicare HMO

## 2019-07-18 ENCOUNTER — Other Ambulatory Visit: Payer: Self-pay

## 2019-07-18 DIAGNOSIS — E039 Hypothyroidism, unspecified: Secondary | ICD-10-CM

## 2019-07-19 ENCOUNTER — Other Ambulatory Visit: Payer: Self-pay | Admitting: Adult Health

## 2019-07-19 DIAGNOSIS — E039 Hypothyroidism, unspecified: Secondary | ICD-10-CM

## 2019-07-19 LAB — TSH: TSH: 0.61 u[IU]/mL (ref 0.450–4.500)

## 2019-07-19 MED ORDER — LEVOTHYROXINE SODIUM 88 MCG PO TABS: 88.0000 ug | ORAL_TABLET | Freq: Every day | ORAL | 1 refills | Status: DC

## 2019-08-15 ENCOUNTER — Ambulatory Visit: Payer: Medicare HMO | Admitting: Adult Health

## 2019-08-15 ENCOUNTER — Encounter: Payer: Self-pay | Admitting: Adult Health

## 2019-08-15 VITALS — Temp 97.9°F | Ht 71.0 in | Wt 174.0 lb

## 2019-08-15 DIAGNOSIS — Z0000 Encounter for general adult medical examination without abnormal findings: Secondary | ICD-10-CM

## 2019-08-15 DIAGNOSIS — Z0100 Encounter for examination of eyes and vision without abnormal findings: Secondary | ICD-10-CM | POA: Diagnosis not present

## 2019-08-15 NOTE — Progress Notes (Signed)
Subjective:   Carlos [name] is a 68 y.o. male who presents for Medicare Annual/Subsequent preventive examination.  Review of Systems: General:   Denies fever, chills, unexplained weight loss.  Optho/Auditory:   Denies visual changes, blurred vision/LOV Respiratory:   Denies SOB, DOE more than baseline levels.  Cardiovascular:   Denies chest pain, palpitations, new onset peripheral edema  Gastrointestinal:   Denies nausea, vomiting, diarrhea.  Genitourinary: Denies dysuria, freq/ urgency, flank pain or discharge from genitals.  Endocrine:     Denies hot or cold intolerance, polyuria, polydipsia. Musculoskeletal:   Denies unexplained myalgias, joint swelling, unexplained arthralgias, gait problems.  Skin:  Denies rash, suspicious lesions Neurological:     Denies dizziness, unexplained weakness, numbness  Psychiatric/Behavioral:   Denies mood changes, suicidal or homicidal ideations, hallucinations      Objective:    Vitals: There were no vitals taken for this visit.  There is no height or weight on file to calculate BMI.  Advanced Directives 12/15/2016 04/21/2016 03/04/2015 11/13/2014 07/10/2014  Does Patient Have a Medical Advance Directive? Yes No No Yes Yes  Type of Paramedic of [place];Living will - - Living will;Healthcare Power of [place]  Does patient want to make changes to medical advance directive? - - - No - Patient declined Yes - information given  Copy of [place] in Chart? - - - No - copy requested No - copy requested  Would patient like information on creating a medical advance directive? - No - patient declined information No - patient declined information - -    Tobacco Social History   Tobacco Use  Smoking Status Never Smoker  Smokeless Tobacco Current User  . Types: Chew     Ready to quit: Not Answered Counseling given: Not Answered    Past Medical History:  Diagnosis Date   . HLD (hyperlipidemia)   . Hypothyroidism   . Irregular heartbeat    self-reported "slow heartbeat"   Past Surgical History:  Procedure Laterality Date  . NO PAST SURGERIES     as of 02/2014   Family History  Problem Relation Age of Onset  . Prostate cancer Father        Spread to bones  . Alcoholism Other        Mother and Father  . COPD Brother        Emphysema  . Diabetes Brother        neither required medicine  . Heart disease Other        Grandparents  . Early death Other        Mother, 92 (PNA); Father, 83 (cancer)  . Hypertension Brother   . Hyperlipidemia Brother   . Thyroid disease Other        Brother and grandfather  . Colon cancer Neg Hx    Social History   Socioeconomic History  . Marital status: Single    Spouse name: Not on file  . Number of children: 1  . Years of education: 8  . Highest education level: Not on file  Occupational History  . Occupation: retired-brick yard   Scientific laboratory technician  . Financial resource strain: Not on file  . Food insecurity    Worry: Not on file    Inability: Not on file  . Transportation needs    Medical: Not on file    Non-medical: Not on file  Tobacco Use  . Smoking status: Never Smoker  . Smokeless  tobacco: Current User    Types: Chew  Substance and Sexual Activity  . Alcohol use: No    Alcohol/week: 0.0 standard drinks  . Drug use: No  . Sexual activity: Never  Lifestyle  . Physical activity    Days per week: Not on file    Minutes per session: Not on file  . Stress: Not on file  Relationships  . Social Herbalist on phone: Not on file    Gets together: Not on file    Attends religious service: Not on file    Active member of club or organization: Not on file    Attends meetings of clubs or organizations: Not on file    Relationship status: Not on file  Other Topics Concern  . Not on file  Social History Narrative  . Not on file    Outpatient Encounter Medications as of 08/15/2019   Medication Sig  . ALOE VERA PO Take 1 capsule by mouth daily.  Marland Kitchen aspirin 81 MG tablet Take 81 mg by mouth daily.  Marland Kitchen esomeprazole (NEXIUM) 20 MG capsule Take 1 capsule (20 mg total) by mouth daily at 12 noon.  Marland Kitchen levothyroxine (SYNTHROID) 88 MCG tablet Take 1 tablet (88 mcg total) by mouth daily.  Marland Kitchen lovastatin (MEVACOR) 40 MG tablet Take 1 tablet (40 mg total) by mouth at bedtime.  Marland Kitchen OVER THE COUNTER MEDICATION Take 200 mcg by mouth daily. SELENIUM SUPPLEMENT - OTC DAILY  . OVER THE COUNTER MEDICATION Take 2.5 g by mouth daily. SPIRULINA - OTC DAILY  . OVER THE COUNTER MEDICATION Take 2 g by mouth daily. BARLEY GRASS - OTC DAILY  . OVER THE COUNTER MEDICATION Turmeric 450mg  2-3 times per week  . Probiotic Product (SOLUBLE FIBER/PROBIOTICS PO) Take 1 capsule by mouth 1 day or 1 dose.  . vitamin E 200 UNIT capsule Take 100 Units by mouth daily.    No facility-administered encounter medications on file as of 08/15/2019.     Activities of Daily Living In your present state of health, do you have any difficulty performing the following activities: 08/15/2019  Hearing? N  Vision? N  Difficulty concentrating or making decisions? N  Walking or climbing stairs? N  Dressing or bathing? N  Doing errands, shopping? N  Some recent data might be hidden    Patient Care Team: Esaw Grandchild, NP as PCP - General (Family Medicine) Gatha Mayer, MD as Consulting Physician (Gastroenterology)    Assessment:   This is a routine wellness examination for [name].  Exercise Activities and Dietary recommendations  Leaf raking 2-3 hrs per day 5 days week-denies CP/chest tightness with exertion Follows Heart Healthy diet Drinks >40 oz water/day Continues to use smokeless tobacco/daily- declined tobacco cessation.  Fall Risk Fall Risk  08/15/2019 05/23/2019 06/12/2018 05/18/2017 12/15/2016  Falls in the past year? 0 0 No No No  Follow up Falls evaluation completed Falls evaluation completed - - -   Is the  patient's home free of loose throw rugs in walkways, pet beds, electrical cords, etc?   no      Grab bars in the bathroom? no      Handrails on the stairs?   yes      Adequate lighting?   yes  Timed Get Up and Go Performed: N/A, encounter not performed in clinic   Depression Screen PHQ 2/9 Scores 08/15/2019 11/21/2018 06/12/2018 12/04/2017  PHQ - 2 Score 0 0 0 0  PHQ- 9 Score 1  0 0 0    Cognitive Function MMSE - Mini Mental State Exam 07/10/2014  Orientation to time 5  Orientation to Place 5  Registration 3  Attention/ Calculation 5  Recall 3  Language- name 2 objects 2  Language- repeat 1  Language- follow 3 step command 3  Language- read & follow direction 1  Write a sentence 1  Copy design 1  Total score 30     6CIT Screen 08/15/2019 05/18/2017  What Year? 0 points 0 points  What month? 0 points 0 points  What time? 0 points 0 points  Count back from 20 0 points 0 points  Months in reverse 0 points 0 points  Repeat phrase 0 points 0 points  Total Score 0 0    Immunization History  Administered Date(s) Administered  . Influenza, High Dose Seasonal PF 06/04/2015, 06/06/2016, 05/21/2019  . Influenza-Unspecified 05/13/2013, 05/13/2014, 06/23/2014, 06/12/2017  . Pneumococcal Conjugate-13 06/22/2018  . Pneumococcal Polysaccharide-23 01/06/2014, 12/15/2014  . Tdap 09/12/2012  . Zoster 08/12/2013  . Zoster Recombinat (Shingrix) 03/19/2018, 06/12/2018    Qualifies for Shingles Vaccine? N/A- he has completed series  Screening Tests Health Maintenance  Topic Date Due  . INFLUENZA VACCINE  04/13/2019  . TETANUS/TDAP  09/12/2022  . COLONOSCOPY  11/26/2024  . Hepatitis C Screening  Completed   Cancer Screenings: Lung: Low Dose CT Chest recommended if Age 69-80 years, 30 pack-year currently smoking OR have quit w/in 15years. Patient does not qualify. Colorectal: Done 11/27/2014  Additional Screenings:  Hepatitis C Screening:  07/10/2014- negative      Plan:     Continue all medications as directed. Remain well hydrated, follow Mediterranean Diet He again declined ECHO STRESS test due to COVID exposure concerns- will address at f/u He has only been on new dose of levothyroxine one week- will push TSH re-check to first week in Jan 2021 Continue to social distance and wear a mask when in public.  I have personally reviewed and noted the following in the patient's chart:   . Medical and social history . Use of alcohol, tobacco or illicit drugs  . Current medications and supplements . Functional ability and status . Nutritional status . Physical activity . Advanced directives . List of other physicians . Hospitalizations, surgeries, and ER visits in previous 12 months . Vitals . Screenings to include cognitive, depression, and falls . Referrals and appointments  In addition, I have reviewed and discussed with patient certain preventive protocols, quality metrics, and best practice recommendations. A written personalized care plan for preventive services as well as general preventive health recommendations were provided to patient.     Jennet Maduro, [place]  08/15/2019

## 2019-08-16 ENCOUNTER — Other Ambulatory Visit: Payer: Medicare HMO

## 2019-09-20 DIAGNOSIS — Z0100 Encounter for examination of eyes and vision without abnormal findings: Secondary | ICD-10-CM | POA: Diagnosis not present

## 2019-10-09 ENCOUNTER — Other Ambulatory Visit: Payer: Self-pay

## 2019-10-09 ENCOUNTER — Other Ambulatory Visit: Payer: Medicare HMO

## 2019-10-09 DIAGNOSIS — E039 Hypothyroidism, unspecified: Secondary | ICD-10-CM | POA: Diagnosis not present

## 2019-10-10 ENCOUNTER — Other Ambulatory Visit: Payer: Self-pay | Admitting: Adult Health

## 2019-10-10 LAB — T3: T3, Total: 99 ng/dL (ref 71–180)

## 2019-10-10 LAB — T4, FREE: Free T4: 1.58 ng/dL (ref 0.82–1.77)

## 2019-10-10 MED ORDER — LEVOTHYROXINE SODIUM 88 MCG PO TABS: 88.0000 ug | ORAL_TABLET | Freq: Every day | ORAL | 0 refills | Status: DC

## 2019-11-26 ENCOUNTER — Other Ambulatory Visit: Payer: Self-pay | Admitting: Adult Health

## 2019-11-26 MED ORDER — LOVASTATIN 40 MG PO TABS: 40.0000 mg | ORAL_TABLET | Freq: Every day | ORAL | 3 refills | Status: DC

## 2019-11-26 NOTE — Telephone Encounter (Signed)
Per Dr. Raliegh Scarlet, okay to refill for 1 year.  RX sent to pharmacy.  Charyl Bigger, CMA

## 2019-11-26 NOTE — Telephone Encounter (Signed)
Patient called requested refill on (1) year refill on his Rx :   lovastatin (MEVACOR) 40 MG tablet [id number]   Order Details Dose: 40 mg Route: Oral Frequency: Daily at bedtime  Dispense Quantity: 90 tablet Refills: 1       Sig: Take 1 tablet (40 mg total) by mouth at bedtime.       --Forwarding request to med asst that if approved please send to :     [place], [place] [phone] (Phone) [phone] (Fax   --glh

## 2020-03-02 ENCOUNTER — Telehealth: Payer: Self-pay | Admitting: Physician Assistant

## 2020-03-02 ENCOUNTER — Other Ambulatory Visit: Payer: Self-pay | Admitting: Physician Assistant

## 2020-03-02 MED ORDER — LEVOTHYROXINE SODIUM 88 MCG PO TABS: 88.0000 ug | ORAL_TABLET | Freq: Every day | ORAL | 0 refills | Status: DC

## 2020-03-02 NOTE — Telephone Encounter (Signed)
Refill sent to pharmacy. AS, CMA 

## 2020-03-02 NOTE — Telephone Encounter (Signed)
Patient is requesting a refill of his thyroid med, if approved please send to [facility].

## 2020-06-01 ENCOUNTER — Other Ambulatory Visit: Payer: Self-pay | Admitting: Adult Health

## 2020-06-09 ENCOUNTER — Telehealth: Payer: Self-pay | Admitting: Physician Assistant

## 2020-06-09 MED ORDER — LEVOTHYROXINE SODIUM 88 MCG PO TABS: 88.0000 ug | ORAL_TABLET | Freq: Every day | ORAL | 0 refills | Status: DC

## 2020-06-09 NOTE — Telephone Encounter (Signed)
Patient called in requesting a refill on Synthroid. He stated he only has 13 pills left. Please send to [facility]

## 2020-06-09 NOTE — Addendum Note (Signed)
Addended by: Mickel Crow on: 06/09/2020 11:13 AM   Modules accepted: Orders

## 2020-06-09 NOTE — Telephone Encounter (Signed)
Refill sent to requested pharmacy. AS, CMA 

## 2020-08-12 ENCOUNTER — Other Ambulatory Visit: Payer: Self-pay | Admitting: Physician Assistant

## 2020-08-12 DIAGNOSIS — E7849 Other hyperlipidemia: Secondary | ICD-10-CM

## 2020-08-12 DIAGNOSIS — E039 Hypothyroidism, unspecified: Secondary | ICD-10-CM

## 2020-08-12 DIAGNOSIS — E785 Hyperlipidemia, unspecified: Secondary | ICD-10-CM

## 2020-08-12 DIAGNOSIS — Z0000 Encounter for general adult medical examination without abnormal findings: Secondary | ICD-10-CM

## 2020-08-13 ENCOUNTER — Other Ambulatory Visit: Payer: Medicare HMO

## 2020-08-13 ENCOUNTER — Other Ambulatory Visit: Payer: Self-pay

## 2020-08-13 DIAGNOSIS — E039 Hypothyroidism, unspecified: Secondary | ICD-10-CM | POA: Diagnosis not present

## 2020-08-13 DIAGNOSIS — Z0000 Encounter for general adult medical examination without abnormal findings: Secondary | ICD-10-CM | POA: Diagnosis not present

## 2020-08-13 DIAGNOSIS — E7849 Other hyperlipidemia: Secondary | ICD-10-CM | POA: Diagnosis not present

## 2020-08-13 DIAGNOSIS — E785 Hyperlipidemia, unspecified: Secondary | ICD-10-CM | POA: Diagnosis not present

## 2020-08-14 LAB — COMPREHENSIVE METABOLIC PANEL
ALT: 13 IU/L (ref 0–44)
AST: 18 IU/L (ref 0–40)
Albumin/Globulin Ratio: 2 (ref 1.2–2.2)
Albumin: 4.3 g/dL (ref 3.7–4.7)
Alkaline Phosphatase: 74 IU/L (ref 44–121)
BUN/Creatinine Ratio: 15 (ref 10–24)
BUN: 14 mg/dL (ref 8–27)
Bilirubin Total: 0.9 mg/dL (ref 0.0–1.2)
CO2: 23 mmol/L (ref 20–29)
Calcium: 8.9 mg/dL (ref 8.6–10.2)
Chloride: 106 mmol/L (ref 96–106)
Creatinine, Ser: 0.95 mg/dL (ref 0.76–1.27)
GFR calc Af Amer: 93 mL/min/{1.73_m2} (ref >59)
GFR calc non Af Amer: 80 mL/min/{1.73_m2} (ref >59)
Globulin, Total: 2.1 g/dL (ref 1.5–4.5)
Glucose: 93 mg/dL (ref 65–99)
Potassium: 4.3 mmol/L (ref 3.5–5.2)
Sodium: 143 mmol/L (ref 134–144)
Total Protein: 6.4 g/dL (ref 6.0–8.5)

## 2020-08-14 LAB — CBC
Hematocrit: 43.6 % (ref 37.5–51.0)
Hemoglobin: 14.5 g/dL (ref 13.0–17.7)
MCH: 30.4 pg (ref 26.6–33.0)
MCHC: 33.3 g/dL (ref 31.5–35.7)
MCV: 91 fL (ref 79–97)
Platelets: 172 10*3/uL (ref 150–450)
RBC: 4.77 x10E6/uL (ref 4.14–5.80)
RDW: 12.2 % (ref 11.6–15.4)
WBC: 4.5 10*3/uL (ref 3.4–10.8)

## 2020-08-14 LAB — LIPID PANEL
Chol/HDL Ratio: 2.8 ratio (ref 0.0–5.0)
Cholesterol, Total: 147 mg/dL (ref 100–199)
HDL: 52 mg/dL (ref >39)
LDL Chol Calc (NIH): 84 mg/dL (ref 0–99)
Triglycerides: 50 mg/dL (ref 0–149)
VLDL Cholesterol Cal: 11 mg/dL (ref 5–40)

## 2020-08-14 LAB — TSH: TSH: 3.81 u[IU]/mL (ref 0.450–4.500)

## 2020-08-14 LAB — HEMOGLOBIN A1C
Est. average glucose Bld gHb Est-mCnc: 111 mg/dL
Hgb A1c MFr Bld: 5.5 % (ref 4.8–5.6)

## 2020-08-17 ENCOUNTER — Ambulatory Visit (INDEPENDENT_AMBULATORY_CARE_PROVIDER_SITE_OTHER): Payer: Medicare HMO | Admitting: Physician Assistant

## 2020-08-17 ENCOUNTER — Other Ambulatory Visit: Payer: Self-pay

## 2020-08-17 ENCOUNTER — Encounter: Payer: Self-pay | Admitting: Physician Assistant

## 2020-08-17 VITALS — Ht 71.0 in | Wt 179.0 lb

## 2020-08-17 DIAGNOSIS — E785 Hyperlipidemia, unspecified: Secondary | ICD-10-CM

## 2020-08-17 DIAGNOSIS — E039 Hypothyroidism, unspecified: Secondary | ICD-10-CM | POA: Diagnosis not present

## 2020-08-17 DIAGNOSIS — Z0000 Encounter for general adult medical examination without abnormal findings: Secondary | ICD-10-CM

## 2020-08-17 MED ORDER — LOVASTATIN 40 MG PO TABS: 40.0000 mg | ORAL_TABLET | Freq: Every day | ORAL | 3 refills | Status: DC

## 2020-08-17 MED ORDER — LEVOTHYROXINE SODIUM 88 MCG PO TABS: 88.0000 ug | ORAL_TABLET | Freq: Every day | ORAL | 3 refills | Status: DC

## 2020-08-17 NOTE — Progress Notes (Addendum)
Virtual Visit via Telephone Note:  I connected with Carlos Carlos [name] by telephone and verified that I am speaking with the correct person using two identifiers.    I discussed the limitations, risks, security and privacy concerns for performing an evaluation and management service by telephone and the availability of in person appointments. The staff discussed with patient that there may be a patient responsible charge related to this service. The patient expressed understanding and agreed to proceed.   Location of Patient- Home Location of Provider- Office     Subjective:   Carlos Carlos [name] is a 69 y.o. male who presents for Medicare Annual/Subsequent preventive examination.  Review of Systems    General:   No F/C, wt loss Pulm:   No DIB, SOB, cough Card:  No CP, palpitations Abd:  No n/v/d or pain Ext:  No inc edema from baseline   Objective:    Today's Vitals   08/17/20 1134  Weight: 179 lb (81.2 kg)  Height: 5\' 11"  (1.803 m)   Body mass index is 24.97 kg/m.  Advanced Directives 12/15/2016 04/21/2016 03/04/2015 11/13/2014 07/10/2014  Does Patient Have a Medical Advance Directive? Yes No No Yes Yes  Type of Paramedic of [place];Living will - - Living will;Healthcare Power of [place]  Does patient want to make changes to medical advance directive? - - - No - Patient declined Yes - information given  Copy of [place] in Chart? - - - No - copy requested No - copy requested  Would patient like information on creating a medical advance directive? - No - patient declined information No - patient declined information - -    Current Medications (verified) Outpatient Encounter Medications as of 08/17/2020  Medication Sig  . ALOE VERA PO Take 1 capsule by mouth daily.  Marland Kitchen aspirin 81 MG tablet Take 81 mg by mouth daily.  Marland Kitchen levothyroxine (SYNTHROID) 88 MCG tablet Take 1 tablet (88 mcg total) by mouth daily.   Marland Kitchen lovastatin (MEVACOR) 40 MG tablet Take 1 tablet (40 mg total) by mouth at bedtime.  Marland Kitchen OVER THE COUNTER MEDICATION Take 200 mcg by mouth daily. SELENIUM SUPPLEMENT - OTC DAILY  . OVER THE COUNTER MEDICATION Take 2.5 g by mouth daily. SPIRULINA - OTC DAILY  . OVER THE COUNTER MEDICATION Take 2 g by mouth daily. BARLEY GRASS - OTC DAILY  . OVER THE COUNTER MEDICATION Turmeric 450mg  2-3 times per week  . Probiotic Product (SOLUBLE FIBER/PROBIOTICS PO) Take 1 capsule by mouth 1 day or 1 dose.  . vitamin E 200 UNIT capsule Take 100 Units by mouth daily.    No facility-administered encounter medications on file as of 08/17/2020.    Allergies (verified) Patient has no known allergies.   History: Past Medical History:  Diagnosis Date  . HLD (hyperlipidemia)   . Hypothyroidism   . Irregular heartbeat    self-reported "slow heartbeat"   Past Surgical History:  Procedure Laterality Date  . NO PAST SURGERIES     as of 02/2014   Family History  Problem Relation Age of Onset  . Prostate cancer Father        Spread to bones  . Alcoholism Other        Mother and Father  . COPD Brother        Emphysema  . Diabetes Brother        neither required medicine  . Heart disease Other  Grandparents  . Early death Other        Mother, 28 (PNA); Father, 24 (cancer)  . Hypertension Brother   . Hyperlipidemia Brother   . Thyroid disease Other        Brother and grandfather  . Colon cancer Neg Hx    Social History   Socioeconomic History  . Marital status: Single    Spouse name: Not on file  . Number of children: 1  . Years of education: 51  . Highest education level: Not on file  Occupational History  . Occupation: retired-brick yard   Tobacco Use  . Smoking status: Never Carlos [name]  . Smokeless tobacco: Current User    Types: Chew  Vaping Use  . Vaping Use: Never used  Substance and Sexual Activity  . Alcohol use: No    Alcohol/week: 0.0 standard drinks  . Drug use: No  .  Sexual activity: Never  Other Topics Concern  . Not on file  Social History Narrative  . Not on file   Social Determinants of Health   Financial Resource Strain:   . Difficulty of Paying Living Expenses: Not on file  Food Insecurity:   . Worried About Charity fundraiser in the Last Year: Not on file  . Ran Out of Food in the Last Year: Not on file  Transportation Needs:   . Lack of Transportation (Medical): Not on file  . Lack of Transportation (Non-Medical): Not on file  Physical Activity:   . Days of Exercise per Week: Not on file  . Minutes of Exercise per Session: Not on file  Stress:   . Feeling of Stress : Not on file  Social Connections:   . Frequency of Communication with Friends and Family: Not on file  . Frequency of Social Gatherings with Friends and Family: Not on file  . Attends Religious Services: Not on file  . Active Member of Clubs or Organizations: Not on file  . Attends Archivist Meetings: Not on file  . Marital Status: Not on file    Tobacco Counseling Ready to quit: Not Answered Counseling given: Not Answered    Diabetic? No         Activities of Daily Living In your present state of health, do you have any difficulty performing the following activities: 08/17/2020  Hearing? N  Vision? N  Difficulty concentrating or making decisions? N  Walking or climbing stairs? N  Dressing or bathing? N  Doing errands, shopping? N  Some recent data might be hidden    Patient Care Team: Lorrene Reid, PA-C as PCP - General (Physician Assistant) Gatha Mayer, MD as Consulting Physician (Gastroenterology)  Indicate any recent Medical Services you may have received from other than [facility] providers in the past year (date may be approximate).     Assessment:   This is a routine wellness examination for Carlos [name].  Hearing/Vision screen No exam data present  Dietary issues and exercise activities discussed: -Follow a heart healthy diet and  continue to stay as active as possible.  Goals   None    Depression Screen PHQ 2/9 Scores 08/17/2020 08/15/2019 11/21/2018 06/12/2018 12/04/2017 11/16/2017 09/14/2017  PHQ - 2 Score 0 0 0 0 0 0 0  PHQ- 9 Score 0 1 0 0 0 0 0    Fall Risk Fall Risk  08/17/2020 08/15/2019 05/23/2019 06/12/2018 05/18/2017  Falls in the past year? 0 0 0 No No  Risk for fall due to : No  Fall Risks - - - -  Follow up Falls evaluation completed Falls evaluation completed Falls evaluation completed - -    FALL RISK PREVENTION PERTAINING TO THE HOME:  Any stairs in or around the home? Yes  If so, are there any without handrails? No  Home free of loose throw rugs in walkways, pet beds, electrical cords, etc? No  Adequate lighting in your home to reduce risk of falls? Yes   ASSISTIVE DEVICES UTILIZED TO PREVENT FALLS:  Life alert? No  Use of a cane, walker or w/c? No  Grab bars in the bathroom? No  Shower chair or bench in shower? No  Elevated toilet seat or a handicapped toilet? No   TIMED UP AND GO:  Was the test performed? No .  Pt not seen in office.    Cognitive Function: wnl MMSE - Mini Mental State Exam 07/10/2014  Orientation to time 5  Orientation to Place 5  Registration 3  Attention/ Calculation 5  Recall 3  Language- name 2 objects 2  Language- repeat 1  Language- follow 3 step command 3  Language- read & follow direction 1  Write a sentence 1  Copy design 1  Total score 30     6CIT Screen 08/17/2020 08/15/2019 05/18/2017  What Year? 0 points 0 points 0 points  What month? 0 points 0 points 0 points  What time? 0 points 0 points 0 points  Count back from 20 0 points 0 points 0 points  Months in reverse 0 points 0 points 0 points  Repeat phrase 0 points 0 points 0 points  Total Score 0 0 0    Immunizations Immunization History  Administered Date(s) Administered  . Influenza, High Dose Seasonal PF 06/04/2015, 06/06/2016, 05/21/2019, 06/08/2020  . Influenza-Unspecified 05/13/2013,  05/13/2014, 06/23/2014, 06/12/2017  . Pneumococcal Conjugate-13 06/22/2018  . Pneumococcal Polysaccharide-23 01/06/2014, 12/15/2014  . Tdap 09/12/2012  . Zoster 08/12/2013  . Zoster Recombinat (Shingrix) 03/19/2018, 06/12/2018    TDAP status: Up to date  Flu Vaccine status: Up to date  Pneumococcal vaccine status: Up to date  Covid-19 vaccine status: Completed vaccines  Qualifies for Shingles Vaccine? Yes   Zostavax completed Yes   Shingrix Completed?: Yes  Screening Tests Health Maintenance  Topic Date Due  . COVID-19 Vaccine (1) Never done  . TETANUS/TDAP  09/12/2022  . COLONOSCOPY  11/26/2024  . INFLUENZA VACCINE  Completed  . Hepatitis C Screening  Completed    Health Maintenance  Health Maintenance Due  Topic Date Due  . COVID-19 Vaccine (1) Never done    Colorectal cancer screening: Type of screening: Colonoscopy. Completed 11/17/2014. Repeat every 10 years  Lung Cancer Screening: (Low Dose CT Chest recommended if Age 83-80 years, 30 pack-year currently smoking OR have quit w/in 15years.) does not qualify.     Additional Screening:  Hepatitis C Screening: does qualify; Completed 07/10/2014  Vision Screening: Recommended annual ophthalmology exams for early detection of glaucoma and other disorders of the eye. Is the patient up to date with their annual eye exam?  Yes  Who is the provider or what is the name of the office in which the patient attends annual eye exams? [facility]  Dental Screening: Recommended annual dental exams for proper oral hygiene  Community Resource Referral / Chronic Care Management: CRR required this visit?  No   CCM required this visit?  No      Plan:  -Recent labs obtained are essentially within normal limits.  -Continue Levothyroxine 88  mcg and Lovastatin. Provided refills. -Recommend to avoid/reduce provocative foods for heartburn. Continue Nexium.  -Follow up in 6 months for hypothyroidism, hyperlipidemia and FBW  (lipid panel, cmp, tsh)  I have personally reviewed and noted the following in the patient's chart:   . Medical and social history . Use of alcohol, tobacco or illicit drugs  . Current medications and supplements . Functional ability and status . Nutritional status . Physical activity . Advanced directives . List of other physicians . Hospitalizations, surgeries, and ER visits in previous 12 months . Vitals . Screenings to include cognitive, depression, and falls . Referrals and appointments  In addition, I have reviewed and discussed with patient certain preventive protocols, quality metrics, and best practice recommendations. A written personalized care plan for preventive services as well as general preventive health recommendations were provided to patient.

## 2021-05-24 DIAGNOSIS — H40033 Anatomical narrow angle, bilateral: Secondary | ICD-10-CM | POA: Diagnosis not present

## 2021-05-24 DIAGNOSIS — H2513 Age-related nuclear cataract, bilateral: Secondary | ICD-10-CM | POA: Diagnosis not present

## 2021-06-03 ENCOUNTER — Other Ambulatory Visit: Payer: Self-pay | Admitting: Physician Assistant

## 2021-06-03 DIAGNOSIS — E039 Hypothyroidism, unspecified: Secondary | ICD-10-CM

## 2021-06-09 DIAGNOSIS — H5213 Myopia, bilateral: Secondary | ICD-10-CM | POA: Diagnosis not present

## 2021-07-26 ENCOUNTER — Other Ambulatory Visit: Payer: Self-pay | Admitting: Physician Assistant

## 2021-07-26 DIAGNOSIS — E039 Hypothyroidism, unspecified: Secondary | ICD-10-CM

## 2021-08-23 ENCOUNTER — Other Ambulatory Visit: Payer: Self-pay | Admitting: Physician Assistant

## 2021-08-23 DIAGNOSIS — Z0000 Encounter for general adult medical examination without abnormal findings: Secondary | ICD-10-CM

## 2021-08-23 DIAGNOSIS — Z6825 Body mass index (BMI) 25.0-25.9, adult: Secondary | ICD-10-CM

## 2021-08-23 DIAGNOSIS — E039 Hypothyroidism, unspecified: Secondary | ICD-10-CM

## 2021-08-23 DIAGNOSIS — E785 Hyperlipidemia, unspecified: Secondary | ICD-10-CM

## 2021-08-25 ENCOUNTER — Other Ambulatory Visit: Payer: Self-pay

## 2021-08-25 ENCOUNTER — Other Ambulatory Visit: Payer: Medicare HMO

## 2021-08-25 DIAGNOSIS — E039 Hypothyroidism, unspecified: Secondary | ICD-10-CM

## 2021-08-25 DIAGNOSIS — Z6825 Body mass index (BMI) 25.0-25.9, adult: Secondary | ICD-10-CM

## 2021-08-25 DIAGNOSIS — Z0000 Encounter for general adult medical examination without abnormal findings: Secondary | ICD-10-CM

## 2021-08-25 DIAGNOSIS — E785 Hyperlipidemia, unspecified: Secondary | ICD-10-CM

## 2021-08-26 LAB — COMPREHENSIVE METABOLIC PANEL
ALT: 15 IU/L (ref 0–44)
AST: 19 IU/L (ref 0–40)
Albumin/Globulin Ratio: 2.8 — ABNORMAL HIGH (ref 1.2–2.2)
Albumin: 4.7 g/dL (ref 3.7–4.7)
Alkaline Phosphatase: 84 IU/L (ref 44–121)
BUN/Creatinine Ratio: 10 (ref 10–24)
BUN: 12 mg/dL (ref 8–27)
Bilirubin Total: 0.9 mg/dL (ref 0.0–1.2)
CO2: 27 mmol/L (ref 20–29)
Calcium: 9.6 mg/dL (ref 8.6–10.2)
Chloride: 102 mmol/L (ref 96–106)
Creatinine, Ser: 1.16 mg/dL (ref 0.76–1.27)
Globulin, Total: 1.7 g/dL (ref 1.5–4.5)
Glucose: 87 mg/dL (ref 70–99)
Potassium: 4.5 mmol/L (ref 3.5–5.2)
Sodium: 140 mmol/L (ref 134–144)
Total Protein: 6.4 g/dL (ref 6.0–8.5)
eGFR: 67 mL/min/{1.73_m2} (ref >59)

## 2021-08-26 LAB — LIPID PANEL
Chol/HDL Ratio: 2.9 ratio (ref 0.0–5.0)
Cholesterol, Total: 142 mg/dL (ref 100–199)
HDL: 49 mg/dL (ref >39)
LDL Chol Calc (NIH): 78 mg/dL (ref 0–99)
Triglycerides: 77 mg/dL (ref 0–149)
VLDL Cholesterol Cal: 15 mg/dL (ref 5–40)

## 2021-08-26 LAB — CBC
Hematocrit: 45.2 % (ref 37.5–51.0)
Hemoglobin: 15.3 g/dL (ref 13.0–17.7)
MCH: 30.2 pg (ref 26.6–33.0)
MCHC: 33.8 g/dL (ref 31.5–35.7)
MCV: 89 fL (ref 79–97)
Platelets: 172 10*3/uL (ref 150–450)
RBC: 5.06 x10E6/uL (ref 4.14–5.80)
RDW: 12 % (ref 11.6–15.4)
WBC: 4.5 10*3/uL (ref 3.4–10.8)

## 2021-08-26 LAB — HEMOGLOBIN A1C
Est. average glucose Bld gHb Est-mCnc: 114 mg/dL
Hgb A1c MFr Bld: 5.6 % (ref 4.8–5.6)

## 2021-08-26 LAB — TSH: TSH: 4.58 u[IU]/mL — ABNORMAL HIGH (ref 0.450–4.500)

## 2021-09-01 ENCOUNTER — Encounter: Payer: Self-pay | Admitting: Physician Assistant

## 2021-09-01 ENCOUNTER — Other Ambulatory Visit: Payer: Self-pay

## 2021-09-01 ENCOUNTER — Ambulatory Visit (INDEPENDENT_AMBULATORY_CARE_PROVIDER_SITE_OTHER): Payer: Medicare HMO | Admitting: Physician Assistant

## 2021-09-01 VITALS — BP 128/80 | HR 60 | Temp 97.3°F | Ht 71.0 in | Wt 175.0 lb

## 2021-09-01 DIAGNOSIS — E785 Hyperlipidemia, unspecified: Secondary | ICD-10-CM | POA: Diagnosis not present

## 2021-09-01 DIAGNOSIS — Z0000 Encounter for general adult medical examination without abnormal findings: Secondary | ICD-10-CM

## 2021-09-01 DIAGNOSIS — E039 Hypothyroidism, unspecified: Secondary | ICD-10-CM | POA: Diagnosis not present

## 2021-09-01 MED ORDER — LOVASTATIN 40 MG PO TABS: 40.0000 mg | ORAL_TABLET | Freq: Every day | ORAL | 3 refills | Status: DC

## 2021-09-01 NOTE — Patient Instructions (Signed)
Preventive Care 65 Years and Older, Male °Preventive care refers to lifestyle choices and visits with your health care provider that can promote health and wellness. Preventive care visits are also called wellness exams. °What can I expect for my preventive care visit? °Counseling °During your preventive care visit, your health care provider may ask about your: °Medical history, including: °Past medical problems. °Family medical history. °History of falls. °Current health, including: °Emotional well-being. °Home life and relationship well-being. °Sexual activity. °Memory and ability to understand (cognition). °Lifestyle, including: °Alcohol, nicotine or tobacco, and drug use. °Access to firearms. °Diet, exercise, and sleep habits. °Work and work environment. °Sunscreen use. °Safety issues such as seatbelt and bike helmet use. °Physical exam °Your health care provider will check your: °Height and weight. These may be used to calculate your BMI (body mass index). BMI is a measurement that tells if you are at a healthy weight. °Waist circumference. This measures the distance around your waistline. This measurement also tells if you are at a healthy weight and may help predict your risk of certain diseases, such as type 2 diabetes and high blood pressure. °Heart rate and blood pressure. °Body temperature. °Skin for abnormal spots. °What immunizations do I need? °Vaccines are usually given at various ages, according to a schedule. Your health care provider will recommend vaccines for you based on your age, medical history, and lifestyle or other factors, such as travel or where you work. °What tests do I need? °Screening °Your health care provider may recommend screening tests for certain conditions. This may include: °Lipid and cholesterol levels. °Diabetes screening. This is done by checking your blood sugar (glucose) after you have not eaten for a while (fasting). °Hepatitis C test. °Hepatitis B test. °HIV (human  immunodeficiency virus) test. °STI (sexually transmitted infection) testing, if you are at risk. °Lung cancer screening. °Colorectal cancer screening. °Prostate cancer screening. °Abdominal aortic aneurysm (AAA) screening. You may need this if you are a current or former smoker. °Talk with your health care provider about your test results, treatment options, and if necessary, the need for more tests. °Follow these instructions at home: °Eating and drinking ° °Eat a diet that includes fresh fruits and vegetables, whole grains, lean protein, and low-fat dairy products. Limit your intake of foods with high amounts of sugar, saturated fats, and salt. °Take vitamin and mineral supplements as recommended by your health care provider. °Do not drink alcohol if your health care provider tells you not to drink. °If you drink alcohol: °Limit how much you have to 0-2 drinks a day. °Know how much alcohol is in your drink. In the [place] one drink equals one 12 oz bottle of beer (355 mL), one 5 oz glass of wine (148 mL), or one 1½ oz glass of hard liquor (44 mL). °Lifestyle °Brush your teeth every morning and night with fluoride toothpaste. Floss one time each day. °Exercise for at least 30 minutes 5 or more days each week. °Do not use any products that contain nicotine or tobacco. These products include cigarettes, chewing tobacco, and vaping devices, such as e-cigarettes. If you need help quitting, ask your health care provider. °Do not use drugs. °If you are sexually active, practice safe sex. Use a condom or other form of protection to prevent STIs. °Take aspirin only as told by your health care provider. Make sure that you understand how much to take and what form to take. Work with your health care provider to find out whether it is safe and   beneficial for you to take aspirin daily. °Ask your health care provider if you need to take a cholesterol-lowering medicine (statin). °Find healthy ways to manage stress, such  as: °Meditation, yoga, or listening to music. °Journaling. °Talking to a trusted person. °Spending time with friends and family. °Safety °Always wear your seat belt while driving or riding in a vehicle. °Do not drive: °If you have been drinking alcohol. Do not ride with someone who has been drinking. °When you are tired or distracted. °While texting. °If you have been using any mind-altering substances or drugs. °Wear a helmet and other protective equipment during sports activities. °If you have firearms in your house, make sure you follow all gun safety procedures. °Minimize exposure to UV radiation to reduce your risk of skin cancer. °What's next? °Visit your health care provider once a year for an annual wellness visit. °Ask your health care provider how often you should have your eyes and teeth checked. °Stay up to date on all vaccines. °This information is not intended to replace advice given to you by your health care provider. Make sure you discuss any questions you have with your health care provider. °Document Revised: 02/24/2021 Document Reviewed: 02/24/2021 °Elsevier Patient Education © 2022 [employer] ° °

## 2021-09-01 NOTE — Progress Notes (Signed)
Subjective:   Mehdi Gironda is a 70 y.o. male who presents for Medicare Annual/Subsequent preventive examination.  Review of Systems    General:   No F/C, wt loss Pulm:   No DIB, SOB, pleuritic chest pain Card:  No CP, palpitations Abd:  No n/v/d or pain Ext:  No inc edema from baseline    Objective:    Today's Vitals   09/01/21 1133  BP: 128/80   There is no height or weight on file to calculate BMI.  Advanced Directives 12/15/2016 04/21/2016 03/04/2015 11/13/2014 07/10/2014  Does Patient Have a Medical Advance Directive? Yes No No Yes Yes  Type of Paramedic of [place];Living will - - Living will;Healthcare Power of [place]  Does patient want to make changes to medical advance directive? - - - No - Patient declined Yes - information given  Copy of [place] in Chart? - - - No - copy requested No - copy requested  Would patient like information on creating a medical advance directive? - No - patient declined information No - patient declined information - -    Current Medications (verified) Outpatient Encounter Medications as of 09/01/2021  Medication Sig   ALOE VERA PO Take 1 capsule by mouth daily.   aspirin 81 MG tablet Take 81 mg by mouth daily.   levothyroxine (SYNTHROID) 88 MCG tablet TAKE 1 TABLET EVERY DAY. NEED FOLLOW UP   lovastatin (MEVACOR) 40 MG tablet Take 1 tablet (40 mg total) by mouth at bedtime.   OVER THE COUNTER MEDICATION Take 200 mcg by mouth daily. SELENIUM SUPPLEMENT - OTC DAILY   OVER THE COUNTER MEDICATION Take 2.5 g by mouth daily. SPIRULINA - OTC DAILY   OVER THE COUNTER MEDICATION Take 2 g by mouth daily. BARLEY GRASS - OTC DAILY   OVER THE COUNTER MEDICATION Turmeric 450mg  2-3 times per week   Probiotic Product (SOLUBLE FIBER/PROBIOTICS PO) Take 1 capsule by mouth 1 day or 1 dose.   vitamin E 200 UNIT capsule Take 100 Units by mouth daily.    [DISCONTINUED] lovastatin  (MEVACOR) 40 MG tablet Take 1 tablet (40 mg total) by mouth at bedtime.   No facility-administered encounter medications on file as of 09/01/2021.    Allergies (verified) Patient has no known allergies.   History: Past Medical History:  Diagnosis Date   HLD (hyperlipidemia)    Hypothyroidism    Irregular heartbeat    self-reported "slow heartbeat"   Past Surgical History:  Procedure Laterality Date   NO PAST SURGERIES     as of 02/2014   Family History  Problem Relation Age of Onset   Prostate cancer Father        Spread to bones   Alcoholism Other        Mother and Father   COPD Brother        Emphysema   Diabetes Brother        neither required medicine   Heart disease Other        Grandparents   Early death Other        Mother, 28 (PNA); Father, 2 (cancer)   Hypertension Brother    Hyperlipidemia Brother    Thyroid disease Other        Brother and grandfather   Colon cancer Neg Hx    Social History   Socioeconomic History   Marital status: Single    Spouse name: Not on file  Number of children: 1   Years of education: 13   Highest education level: Not on file  Occupational History   Occupation: retired-brick yard   Tobacco Use   Smoking status: Never   Smokeless tobacco: Current    Types: Chew  Vaping Use   Vaping Use: Never used  Substance and Sexual Activity   Alcohol use: No    Alcohol/week: 0.0 standard drinks   Drug use: No   Sexual activity: Never  Other Topics Concern   Not on file  Social History Narrative   Not on file   Social Determinants of Health   Financial Resource Strain: Not on file  Food Insecurity: Not on file  Transportation Needs: Not on file  Physical Activity: Not on file  Stress: Not on file  Social Connections: Not on file    Tobacco Counseling Ready to quit: Not Answered Counseling given: Not Answered     Diabetic?no    Activities of Daily Living In your present state of health, do you have any  difficulty performing the following activities: 09/01/2021  Hearing? N  Vision? N  Difficulty concentrating or making decisions? N  Walking or climbing stairs? N  Dressing or bathing? N  Doing errands, shopping? N  Some recent data might be hidden    Patient Care Team: Lorrene Reid, PA-C as PCP - General (Physician Assistant) Gatha Mayer, MD as Consulting Physician (Gastroenterology)  Indicate any recent Medical Services you may have received from other than [facility] providers in the past year (date may be approximate).     Assessment:   This is a routine wellness examination for Gianny.  Hearing/Vision screen No results found.  Dietary issues and exercise activities discussed: -Discussed a heart healthy diet low in fat and carbohydrate. Continue to stay active with house work.    Goals Addressed   None   Depression Screen PHQ 2/9 Scores 09/01/2021 08/17/2020 08/15/2019 11/21/2018 06/12/2018 12/04/2017 11/16/2017  PHQ - 2 Score 0 0 0 0 0 0 0  PHQ- 9 Score 0 0 1 0 0 0 0    Fall Risk Fall Risk  09/01/2021 08/17/2020 08/15/2019 05/23/2019 06/12/2018  Falls in the past year? 0 0 0 0 No  Number falls in past yr: 0 - - - -  Injury with Fall? 0 - - - -  Risk for fall due to : No Fall Risks No Fall Risks - - -  Follow up Falls evaluation completed Falls evaluation completed Falls evaluation completed Falls evaluation completed -    FALL RISK PREVENTION PERTAINING TO THE HOME:  Any stairs in or around the home? Yes  If so, are there any without handrails? No  Home free of loose throw rugs in walkways, pet beds, electrical cords, etc? Yes  Adequate lighting in your home to reduce risk of falls? Yes   ASSISTIVE DEVICES UTILIZED TO PREVENT FALLS:  Life alert? No  Use of a cane, walker or w/c? No  Grab bars in the bathroom? No  Shower chair or bench in shower? No  Elevated toilet seat or a handicapped toilet? No   TIMED UP AND GO:  Was the test performed? Yes .  Length of time  to ambulate 10 feet: 10 sec.   Gait steady and fast without use of assistive device  Cognitive Function: MMSE - Mini Mental State Exam 07/10/2014  Orientation to time 5  Orientation to Place 5  Registration 3  Attention/ Calculation 5  Recall 3  Language-  name 2 objects 2  Language- repeat 1  Language- follow 3 step command 3  Language- read & follow direction 1  Write a sentence 1  Copy design 1  Total score 30     6CIT Screen 09/01/2021 08/17/2020 08/15/2019 05/18/2017  What Year? 0 points 0 points 0 points 0 points  What month? 0 points 0 points 0 points 0 points  What time? 0 points 0 points 0 points 0 points  Count back from 20 0 points 0 points 0 points 0 points  Months in reverse 0 points 0 points 0 points 0 points  Repeat phrase 0 points 0 points 0 points 0 points  Total Score 0 0 0 0    Immunizations Immunization History  Administered Date(s) Administered   Fluad Quad(high Dose 65+) 04/21/2021   Influenza, High Dose Seasonal PF 06/04/2015, 06/06/2016, 05/21/2019, 06/08/2020   Influenza-Unspecified 05/13/2013, 05/13/2014, 06/23/2014, 06/12/2017   Moderna SARS-COV2 Booster Vaccination 08/24/2020, 12/28/2020   Moderna Sars-Covid-2 Vaccination 01/21/2020, 02/18/2020, 12/28/2020   Pfizer Covid-19 Vaccine Bivalent Booster 34yrs & up 06/07/2021   Pneumococcal Conjugate-13 06/22/2018   Pneumococcal Polysaccharide-23 01/06/2014, 12/15/2014   Tdap 09/12/2012   Zoster Recombinat (Shingrix) 03/19/2018, 06/12/2018   Zoster, Live 08/12/2013    TDAP status: Up to date  Flu Vaccine status: Up to date  Pneumococcal vaccine status: Up to date  Covid-19 vaccine status: Completed vaccines  Qualifies for Shingles Vaccine? Yes   Zostavax completed No   Shingrix Completed?: Yes  Screening Tests Health Maintenance  Topic Date Due   COVID-19 Vaccine (4 - Booster) 08/02/2021   TETANUS/TDAP  09/12/2022   COLONOSCOPY (Pts 45-22yrs Insurance coverage will need to be confirmed)   11/26/2024   Pneumonia Vaccine 57+ Years old  Completed   INFLUENZA VACCINE  Completed   Hepatitis C Screening  Completed   Zoster Vaccines- Shingrix  Completed   HPV VACCINES  Aged Out    Health Maintenance  Health Maintenance Due  Topic Date Due   COVID-19 Vaccine (4 - Booster) 08/02/2021    Colorectal cancer screening: Type of screening: Colonoscopy. Completed 11/27/2014. Repeat every 10 years  Lung Cancer Screening: (Low Dose CT Chest recommended if Age 75-80 years, 30 pack-year currently smoking OR have quit w/in 15years.) does not qualify.   Lung Cancer Screening Referral: n/a  Additional Screening:  Hepatitis C Screening: does qualify; Completed 07/10/2014  Vision Screening: Recommended annual ophthalmology exams for early detection of glaucoma and other disorders of the eye. Is the patient up to date with their annual eye exam?  Yes  Who is the provider or what is the name of the office in which the patient attends annual eye exams? Dr. Truman Hayward from [facility] If pt is not established with a provider, would they like to be referred to a provider to establish care? No .   Dental Screening: Recommended annual dental exams for proper oral hygiene  Community Resource Referral / Chronic Care Management: CRR required this visit?  No   CCM required this visit?  No      Plan:  -Discussed most recent labs which are essentially within normal limits or stable from prior with the exception of mildly elevated TSH. Will repeat TSH and collect free T4 and T3 in 6 weeks, pending lab results will adjust levothyroxine dose if indicated. -Continue lovastatin 40 mg. -BP initially mildly elevated, BP repeated and improved. -Follow up in 1 year for CPE and FBW few days prior   I have personally reviewed and  noted the following in the patients chart:   Medical and social history Use of alcohol, tobacco or illicit drugs  Current medications and supplements including opioid prescriptions.  Patient is not currently taking opioid prescriptions. Functional ability and status Nutritional status Physical activity Advanced directives List of other physicians Hospitalizations, surgeries, and ER visits in previous 12 months Vitals Screenings to include cognitive, depression, and falls Referrals and appointments  In addition, I have reviewed and discussed with patient certain preventive protocols, quality metrics, and best practice recommendations. A written personalized care plan for preventive services as well as general preventive health recommendations were provided to patient.     Lorrene Reid, PA-C   09/01/2021

## 2021-09-17 ENCOUNTER — Other Ambulatory Visit: Payer: Self-pay | Admitting: Physician Assistant

## 2021-09-17 DIAGNOSIS — E039 Hypothyroidism, unspecified: Secondary | ICD-10-CM

## 2021-10-07 DIAGNOSIS — H524 Presbyopia: Secondary | ICD-10-CM | POA: Diagnosis not present

## 2021-10-12 ENCOUNTER — Other Ambulatory Visit: Payer: Self-pay

## 2021-10-12 DIAGNOSIS — E039 Hypothyroidism, unspecified: Secondary | ICD-10-CM

## 2021-10-13 ENCOUNTER — Other Ambulatory Visit: Payer: Medicare HMO

## 2021-10-13 ENCOUNTER — Other Ambulatory Visit: Payer: Self-pay

## 2021-10-13 DIAGNOSIS — E039 Hypothyroidism, unspecified: Secondary | ICD-10-CM | POA: Diagnosis not present

## 2021-10-14 LAB — T4, FREE: Free T4: 1.49 ng/dL (ref 0.82–1.77)

## 2021-10-14 LAB — TSH: TSH: 5.25 u[IU]/mL — ABNORMAL HIGH (ref 0.450–4.500)

## 2021-10-14 LAB — T3: T3, Total: 99 ng/dL (ref 71–180)

## 2022-01-12 ENCOUNTER — Other Ambulatory Visit: Payer: Medicare HMO

## 2022-01-12 DIAGNOSIS — E039 Hypothyroidism, unspecified: Secondary | ICD-10-CM | POA: Diagnosis not present

## 2022-01-13 LAB — T4, FREE: Free T4: 1.65 ng/dL (ref 0.82–1.77)

## 2022-01-13 LAB — T3: T3, Total: 96 ng/dL (ref 71–180)

## 2022-01-13 LAB — TSH: TSH: 5.06 u[IU]/mL — ABNORMAL HIGH (ref 0.450–4.500)

## 2022-04-27 ENCOUNTER — Other Ambulatory Visit: Payer: Self-pay | Admitting: Physician Assistant

## 2022-04-27 DIAGNOSIS — E039 Hypothyroidism, unspecified: Secondary | ICD-10-CM

## 2022-06-22 ENCOUNTER — Other Ambulatory Visit: Payer: Self-pay | Admitting: Physician Assistant

## 2022-06-22 DIAGNOSIS — E785 Hyperlipidemia, unspecified: Secondary | ICD-10-CM

## 2022-08-25 ENCOUNTER — Other Ambulatory Visit: Payer: Self-pay

## 2022-08-25 DIAGNOSIS — Z0000 Encounter for general adult medical examination without abnormal findings: Secondary | ICD-10-CM

## 2022-08-25 DIAGNOSIS — E039 Hypothyroidism, unspecified: Secondary | ICD-10-CM

## 2022-08-25 DIAGNOSIS — E785 Hyperlipidemia, unspecified: Secondary | ICD-10-CM

## 2022-08-31 ENCOUNTER — Other Ambulatory Visit: Payer: Medicare HMO

## 2022-08-31 DIAGNOSIS — Z0000 Encounter for general adult medical examination without abnormal findings: Secondary | ICD-10-CM | POA: Diagnosis not present

## 2022-08-31 DIAGNOSIS — E039 Hypothyroidism, unspecified: Secondary | ICD-10-CM | POA: Diagnosis not present

## 2022-08-31 DIAGNOSIS — E785 Hyperlipidemia, unspecified: Secondary | ICD-10-CM

## 2022-09-01 LAB — CBC WITH DIFFERENTIAL/PLATELET
Basophils Absolute: 0.1 10*3/uL (ref 0.0–0.2)
Basos: 1 %
EOS (ABSOLUTE): 0.2 10*3/uL (ref 0.0–0.4)
Eos: 3 %
Hematocrit: 44.2 % (ref 37.5–51.0)
Hemoglobin: 14.7 g/dL (ref 13.0–17.7)
Immature Grans (Abs): 0 10*3/uL (ref 0.0–0.1)
Immature Granulocytes: 0 %
Lymphocytes Absolute: 1.5 10*3/uL (ref 0.7–3.1)
Lymphs: 28 %
MCH: 30.2 pg (ref 26.6–33.0)
MCHC: 33.3 g/dL (ref 31.5–35.7)
MCV: 91 fL (ref 79–97)
Monocytes Absolute: 0.6 10*3/uL (ref 0.1–0.9)
Monocytes: 11 %
Neutrophils Absolute: 3 10*3/uL (ref 1.4–7.0)
Neutrophils: 57 %
Platelets: 148 10*3/uL — ABNORMAL LOW (ref 150–450)
RBC: 4.87 x10E6/uL (ref 4.14–5.80)
RDW: 12.1 % (ref 11.6–15.4)
WBC: 5.4 10*3/uL (ref 3.4–10.8)

## 2022-09-01 LAB — LIPID PANEL
Chol/HDL Ratio: 2.9 ratio (ref 0.0–5.0)
Cholesterol, Total: 147 mg/dL (ref 100–199)
HDL: 51 mg/dL
LDL Chol Calc (NIH): 79 mg/dL (ref 0–99)
Triglycerides: 93 mg/dL (ref 0–149)
VLDL Cholesterol Cal: 17 mg/dL (ref 5–40)

## 2022-09-01 LAB — COMPREHENSIVE METABOLIC PANEL
ALT: 15 IU/L (ref 0–44)
AST: 16 IU/L (ref 0–40)
Albumin/Globulin Ratio: 2.6 — ABNORMAL HIGH (ref 1.2–2.2)
Albumin: 4.5 g/dL (ref 3.8–4.8)
Alkaline Phosphatase: 80 IU/L (ref 44–121)
BUN/Creatinine Ratio: 12 (ref 10–24)
BUN: 13 mg/dL (ref 8–27)
Bilirubin Total: 0.9 mg/dL (ref 0.0–1.2)
CO2: 24 mmol/L (ref 20–29)
Calcium: 9.7 mg/dL (ref 8.6–10.2)
Chloride: 105 mmol/L (ref 96–106)
Creatinine, Ser: 1.08 mg/dL (ref 0.76–1.27)
Globulin, Total: 1.7 g/dL (ref 1.5–4.5)
Glucose: 94 mg/dL (ref 70–99)
Potassium: 4.2 mmol/L (ref 3.5–5.2)
Sodium: 143 mmol/L (ref 134–144)
Total Protein: 6.2 g/dL (ref 6.0–8.5)
eGFR: 72 mL/min/{1.73_m2} (ref >59)

## 2022-09-01 LAB — T3: T3, Total: 95 ng/dL (ref 71–180)

## 2022-09-01 LAB — HEMOGLOBIN A1C
Est. average glucose Bld gHb Est-mCnc: 114 mg/dL
Hgb A1c MFr Bld: 5.6 % (ref 4.8–5.6)

## 2022-09-01 LAB — TSH: TSH: 4.02 u[IU]/mL (ref 0.450–4.500)

## 2022-09-01 LAB — T4, FREE: Free T4: 1.53 ng/dL (ref 0.82–1.77)

## 2022-09-07 ENCOUNTER — Encounter: Payer: Medicare HMO | Admitting: Physician Assistant

## 2022-09-07 ENCOUNTER — Ambulatory Visit (INDEPENDENT_AMBULATORY_CARE_PROVIDER_SITE_OTHER): Payer: Medicare HMO | Admitting: Nurse Practitioner

## 2022-09-07 ENCOUNTER — Encounter: Payer: Self-pay | Admitting: Nurse Practitioner

## 2022-09-07 VITALS — BP 144/81 | HR 60 | Resp 18 | Ht 71.0 in | Wt 175.0 lb

## 2022-09-07 DIAGNOSIS — D696 Thrombocytopenia, unspecified: Secondary | ICD-10-CM | POA: Diagnosis not present

## 2022-09-07 DIAGNOSIS — E039 Hypothyroidism, unspecified: Secondary | ICD-10-CM | POA: Diagnosis not present

## 2022-09-07 DIAGNOSIS — Z0000 Encounter for general adult medical examination without abnormal findings: Secondary | ICD-10-CM | POA: Diagnosis not present

## 2022-09-07 NOTE — Progress Notes (Signed)
Subjective:   Carlos [name] is a 71 y.o. male who presents for Medicare Annual/Subsequent preventive examination. -recent routine, fasting labs  -normal thyroid panel  -normal lipid panel  -mildly reduced platelet count at 148 -He denies chest pain, chest pressure, or shortness of breath. He denies headaches or visual disturbances. He denies abdominal pain, nausea, vomiting, or changes in bowel or bladder habits.    Review of Systems    See  HPI       Objective:    Today's Vitals   09/07/22 1315  BP: (Abnormal) 144/81  Pulse: 60  Resp: 18  SpO2: 98%  Weight: 175 lb (79.4 kg)  Height: '5\' 11"'$  (1.803 m)  PainSc: 0-No pain   Body mass index is 24.41 kg/m.    Row Labels 12/15/2016    1:13 PM 04/21/2016   10:50 AM 03/04/2015   10:57 AM 11/13/2014   12:53 PM 07/10/2014    2:51 PM  Advanced Directives   Section Header. No data exists in this row.       Does Patient Have a Medical Advance Directive?   Yes No No Yes Yes  Type of Scientist, physiological of [place];Living will   Living will;Healthcare Power of [place]  Does patient want to make changes to medical advance directive?      No - Patient declined Yes - information given  Copy of [place] in Chart?      No - copy requested No - copy requested  Would patient like information on creating a medical advance directive?    No - patient declined information No - patient declined information      Current Medications (verified) Outpatient Encounter Medications as of 09/07/2022  Medication Sig   ALOE VERA PO Take 1 capsule by mouth daily.   aspirin 81 MG tablet Take 81 mg by mouth daily.   levothyroxine (SYNTHROID) 88 MCG tablet TAKE 1 TABLET EVERY DAY BEFORE BREAKFAST   lovastatin (MEVACOR) 40 MG tablet TAKE 1 TABLET AT BEDTIME   OVER THE COUNTER MEDICATION Take 200 mcg by mouth daily. SELENIUM SUPPLEMENT - OTC DAILY   OVER THE COUNTER MEDICATION Take 2.5 g  by mouth daily. SPIRULINA - OTC DAILY   OVER THE COUNTER MEDICATION Take 2 g by mouth daily. BARLEY GRASS - OTC DAILY   OVER THE COUNTER MEDICATION Turmeric '450mg'$  2-3 times per week   Probiotic Product (SOLUBLE FIBER/PROBIOTICS PO) Take 1 capsule by mouth 1 day or 1 dose.   vitamin E 200 UNIT capsule Take 100 Units by mouth daily.    No facility-administered encounter medications on file as of 09/07/2022.    Allergies (verified) Patient has no known allergies.   History: Past Medical History:  Diagnosis Date   HLD (hyperlipidemia)    Hypothyroidism    Irregular heartbeat    self-reported "slow heartbeat"   Past Surgical History:  Procedure Laterality Date   NO PAST SURGERIES     as of 02/2014   Family History  Problem Relation Age of Onset   Prostate cancer Father        Spread to bones   Alcoholism Other        Mother and Father   COPD Brother        Emphysema   Diabetes Brother        neither required medicine   Heart disease Other        Grandparents  Early death Other        Mother, 69 (PNA); Father, 95 (cancer)   Hypertension Brother    Hyperlipidemia Brother    Thyroid disease Other        Brother and grandfather   Colon cancer Neg Hx    Social History   Socioeconomic History   Marital status: Single    Spouse name: Not on file   Number of children: 1   Years of education: 13   Highest education level: Not on file  Occupational History   Occupation: retired-brick yard   Tobacco Use   Smoking status: Never    Passive exposure: Never   Smokeless tobacco: Current    Types: Chew  Vaping Use   Vaping Use: Never used  Substance and Sexual Activity   Alcohol use: No    Alcohol/week: 0.0 standard drinks of alcohol   Drug use: No   Sexual activity: Never  Other Topics Concern   Not on file  Social History Narrative   Not on file   Social Determinants of Health   Financial Resource Strain: Low Risk  (09/07/2022)   Overall Financial Resource  Strain (CARDIA)    Difficulty of Paying Living Expenses: Not hard at all  Food Insecurity: No Food Insecurity (09/07/2022)   Hunger Vital Sign    Worried About Running Out of Food in the Last Year: Never true    Ran Out of Food in the Last Year: Never true  Transportation Needs: No Transportation Needs (09/07/2022)   PRAPARE - Hydrologist (Medical): No    Lack of Transportation (Non-Medical): No  Physical Activity: Sufficiently Active (09/07/2022)   Exercise Vital Sign    Days of Exercise per Week: 7 days    Minutes of Exercise per Session: 70 min  Stress: No Stress Concern Present (09/07/2022)   [place]    Feeling of Stress : Not at all  Social Connections: Socially Isolated (09/07/2022)   Social Connection and Isolation Panel [NHANES]    Frequency of Communication with Friends and Family: More than three times a week    Frequency of Social Gatherings with Friends and Family: More than three times a week    Attends Religious Services: Never    Marine scientist or Organizations: No    Attends Archivist Meetings: Never    Marital Status: Divorced  Physical Exam Vitals and nursing note reviewed.  Constitutional:      Appearance: Normal appearance. He is well-developed.  HENT:     Head: Normocephalic and atraumatic.     Right Ear: Tympanic membrane, ear canal and external ear normal.     Left Ear: Tympanic membrane, ear canal and external ear normal.     Nose: Nose normal.     Mouth/Throat:     Mouth: Mucous membranes are moist.     Pharynx: Oropharynx is clear.  Eyes:     Extraocular Movements: Extraocular movements intact.     Conjunctiva/sclera: Conjunctivae normal.     Pupils: Pupils are equal, round, and reactive to light.  Cardiovascular:     Rate and Rhythm: Normal rate and regular rhythm.     Pulses: Normal pulses.     Heart sounds: Normal heart sounds.   Pulmonary:     Effort: Pulmonary effort is normal.     Breath sounds: Normal breath sounds.  Abdominal:     General: Bowel sounds are normal.  There is no distension.     Palpations: Abdomen is soft. There is no mass.     Tenderness: There is no abdominal tenderness. There is no right CVA tenderness, left CVA tenderness, guarding or rebound.     Hernia: No hernia is present.  Musculoskeletal:        General: Normal range of motion.     Cervical back: Normal range of motion and neck supple.  Lymphadenopathy:     Cervical: No cervical adenopathy.  Skin:    General: Skin is warm and dry.     Capillary Refill: Capillary refill takes less than 2 seconds.  Neurological:     General: No focal deficit present.     Mental Status: He is alert and oriented to person, place, and time.  Psychiatric:        Mood and Affect: Mood normal.        Behavior: Behavior normal.        Thought Content: Thought content normal.        Judgment: Judgment normal.      Tobacco Counseling Ready to quit: Not Answered Counseling given: Not Answered   Clinical Intake:  Pre-visit preparation completed: Yes  Pain : No/denies pain Pain Score: 0-No pain     BMI - recorded: 24.41 Nutritional Status: BMI of 19-24  Normal Diabetes: No  How often do you need to have someone help you when you read instructions, pamphlets, or other written materials from your doctor or pharmacy?: 1 - Never  Diabetic? No  Interpreter Needed?: No      Activities of Daily Living   Row Labels 09/07/2022    1:32 PM  In your present state of health, do you have any difficulty performing the following activities:   Section Header. No data exists in this row.   Hearing?   0  Vision?   0  Difficulty concentrating or making decisions?   0  Walking or climbing stairs?   0  Dressing or bathing?   0  Doing errands, shopping?   0    Patient Care Team: Lorrene Reid, PA-C as PCP - General (Physician  Assistant) Gatha Mayer, MD as Consulting Physician (Gastroenterology)  Indicate any recent Medical Services you may have received from other than [facility] providers in the past year (date may be approximate).     Assessment:  1. Encounter for Medicare annual wellness exam Annual Medicare  wellness visit today   2. Acquired hypothyroidism Normal thyroid panel. Continue levothyroxine as prescribed   3. Thrombocytopenia (HCC) Check CBC in 6 weeks. Refer to hematology as indicated. - CBC; Future   Hearing/Vision screen No results found.   Depression Screen   Row Labels 09/07/2022    1:32 PM 09/01/2021   11:07 AM 08/17/2020   11:37 AM 08/15/2019    1:03 PM 11/21/2018   10:56 AM 06/12/2018    2:02 PM 12/04/2017   10:38 AM  PHQ 2/9 Scores   Section Header. No data exists in this row.         PHQ - 2 Score   0 0 0 0 0 0 0  PHQ- 9 Score   0 0 0 1 0 0 0    Fall Risk   Row Labels 09/07/2022    1:32 PM 09/01/2021   11:06 AM 08/17/2020   11:35 AM 08/15/2019    1:02 PM 05/23/2019    1:30 PM  Fall Risk    Section Header. No data exists in  this row.       Falls in the past year?   0 0 0 0 0  Number falls in past yr:   0 0     Injury with Fall?   0 0     Risk for fall due to :    No Fall Risks No Fall Risks    Follow up    Falls evaluation completed Falls evaluation completed Falls evaluation completed Falls evaluation completed    [place]:  Any stairs in or around the home? Yes  If so, are there any without handrails? No  Home free of loose throw rugs in walkways, pet beds, electrical cords, etc? Yes  Adequate lighting in your home to reduce risk of falls? Yes   ASSISTIVE DEVICES UTILIZED TO PREVENT FALLS:  Life alert? No  Use of a cane, walker or w/c? No  Grab bars in the bathroom? No  Shower chair or bench in shower? No  Elevated toilet seat or a handicapped toilet? No   TIMED UP AND GO:  Was the test performed? Yes .  Length of time  to ambulate 10 feet: 10-15 sec.   Gait steady and fast without use of assistive device  Cognitive Function:   Row Labels 07/10/2014    2:00 PM  MMSE - Mini Mental State Exam   Section Header. No data exists in this row.   Orientation to time   5  Orientation to Place   5  Registration   3  Attention/ Calculation   5  Recall   3  Language- name 2 objects   2  Language- repeat   1  Language- follow 3 step command   3  Language- read & follow direction   1  Write a sentence   1  Copy design   1  Total score   30       Row Labels 09/07/2022    1:19 PM 09/01/2021   10:58 AM 08/17/2020   11:39 AM 08/15/2019    1:06 PM 05/18/2017   11:04 AM  6CIT Screen   Section Header. No data exists in this row.       What Year?   0 points 0 points 0 points 0 points 0 points  What month?   0 points 0 points 0 points 0 points 0 points  What time?   0 points 0 points 0 points 0 points 0 points  Count back from 20   0 points 0 points 0 points 0 points 0 points  Months in reverse   0 points 0 points 0 points 0 points 0 points  Repeat phrase   0 points 0 points 0 points 0 points 0 points  Total Score   0 points 0 points 0 points 0 points 0 points    Immunizations Immunization History  Administered Date(s) Administered   Fluad Quad(high Dose 65+) 04/21/2021, 05/30/2022   Influenza, High Dose Seasonal PF 06/04/2015, 06/06/2016, 05/21/2019, 06/08/2020   Influenza-Unspecified 05/13/2013, 05/13/2014, 06/23/2014, 06/12/2017   Moderna SARS-COV2 Booster Vaccination 08/24/2020, 12/28/2020   Moderna Sars-Covid-2 Vaccination 01/21/2020, 02/18/2020, 12/28/2020, 06/20/2022   Pfizer Covid-19 Vaccine Bivalent Booster 53yr & up 06/07/2021   Pneumococcal Conjugate-13 06/22/2018   Pneumococcal Polysaccharide-23 01/06/2014, 12/15/2014   RSV,unspecified 05/30/2022   Tdap 09/12/2012   Zoster Recombinat (Shingrix) 03/19/2018, 06/12/2018   Zoster, Live 08/12/2013    TDAP status: Up to date  Flu Vaccine  status: Up to date  Pneumococcal vaccine status: Up to date  Covid-19 vaccine status: Completed vaccines  Qualifies for Shingles Vaccine? Yes   Zostavax completed Yes   Shingrix Completed?: Yes  Screening Tests Health Maintenance  Topic Date Due   COVID-19 Vaccine (6 - 2023-24 season) 08/15/2022   Medicare Annual Wellness (AWV)  09/01/2022   DTaP/Tdap/Td (2 - Td or Tdap) 09/12/2022   COLONOSCOPY (Pts 45-21yr Insurance coverage will need to be confirmed)  11/26/2024   Pneumonia Vaccine 71 Years old  Completed   INFLUENZA VACCINE  Completed   Hepatitis C Screening  Completed   Zoster Vaccines- Shingrix  Completed   HPV VACCINES  Aged Out    Health Maintenance  Health Maintenance Due  Topic Date Due   COVID-19 Vaccine (6 - 2023-24 season) 08/15/2022   Medicare Annual Wellness (AWV)  09/01/2022   DTaP/Tdap/Td (2 - Td or Tdap) 09/12/2022    Colorectal cancer screening: Type of screening: Colonoscopy. Completed 11/27/2014. Repeat every 10 years  Lung Cancer Screening: (Low Dose CT Chest recommended if Age 73-80years, 30 pack-year currently smoking OR have quit w/in 15years.) does not qualify.   Lung Cancer Screening Referral: Not applicable   Additional Screening:  Hepatitis C Screening: does not qualify; Completed 07/10/2014  Vision Screening: Recommended annual ophthalmology exams for early detection of glaucoma and other disorders of the eye. Is the patient up to date with their annual eye exam?  No  Who is the provider or what is the name of the office in which the patient attends annual eye exams? [facility]/ Dr. LTruman HaywardIf pt is not established with a provider, would they like to be referred to a provider to establish care?  Patient is established .   Dental Screening: Recommended annual dental exams for proper oral hygiene  Community Resource Referral / Chronic Care Management: CRR required this visit?  No   CCM required this visit?  No      Plan:      I have personally reviewed and noted the following in the patient's chart:   Medical and social history Use of alcohol, tobacco or illicit drugs  Current medications and supplements including opioid prescriptions. Patient is not currently taking opioid prescriptions. Functional ability and status Nutritional status Physical activity Advanced directives List of other physicians Hospitalizations, surgeries, and ER visits in previous 12 months Vitals Screenings to include cognitive, depression, and falls Referrals and appointments  In addition, I have reviewed and discussed with patient certain preventive protocols, quality metrics, and best practice recommendations. A written personalized care plan for preventive services as well as general preventive health recommendations were provided to patient.     HRonnell Freshwater NP   10/09/2022   Nurse Notes: Face to face 20 minutes

## 2022-10-03 ENCOUNTER — Other Ambulatory Visit: Payer: Self-pay | Admitting: Nurse Practitioner

## 2022-10-03 DIAGNOSIS — D696 Thrombocytopenia, unspecified: Secondary | ICD-10-CM

## 2022-10-09 DIAGNOSIS — D696 Thrombocytopenia, unspecified: Secondary | ICD-10-CM

## 2022-10-09 HISTORY — DX: Thrombocytopenia, unspecified: D69.6

## 2022-10-19 ENCOUNTER — Other Ambulatory Visit: Payer: Medicare HMO

## 2022-10-19 DIAGNOSIS — D696 Thrombocytopenia, unspecified: Secondary | ICD-10-CM

## 2022-10-20 LAB — CBC WITH DIFFERENTIAL/PLATELET
Basophils Absolute: 0.1 10*3/uL (ref 0.0–0.2)
Basos: 1 %
EOS (ABSOLUTE): 0.2 10*3/uL (ref 0.0–0.4)
Eos: 4 %
Hematocrit: 42.2 % (ref 37.5–51.0)
Hemoglobin: 14.2 g/dL (ref 13.0–17.7)
Immature Grans (Abs): 0 10*3/uL (ref 0.0–0.1)
Immature Granulocytes: 0 %
Lymphocytes Absolute: 1.5 10*3/uL (ref 0.7–3.1)
Lymphs: 30 %
MCH: 30.4 pg (ref 26.6–33.0)
MCHC: 33.6 g/dL (ref 31.5–35.7)
MCV: 90 fL (ref 79–97)
Monocytes Absolute: 0.6 10*3/uL (ref 0.1–0.9)
Monocytes: 12 %
Neutrophils Absolute: 2.6 10*3/uL (ref 1.4–7.0)
Neutrophils: 53 %
Platelets: 144 10*3/uL — ABNORMAL LOW (ref 150–450)
RBC: 4.67 x10E6/uL (ref 4.14–5.80)
RDW: 12.2 % (ref 11.6–15.4)
WBC: 4.9 10*3/uL (ref 3.4–10.8)

## 2022-12-05 ENCOUNTER — Other Ambulatory Visit: Payer: Self-pay

## 2022-12-05 DIAGNOSIS — E039 Hypothyroidism, unspecified: Secondary | ICD-10-CM

## 2022-12-05 MED ORDER — LEVOTHYROXINE SODIUM 88 MCG PO TABS: ORAL_TABLET | ORAL | 1 refills | Status: DC

## 2023-03-13 NOTE — Progress Notes (Signed)
Recheck platelets at next visit.

## 2023-05-01 ENCOUNTER — Other Ambulatory Visit: Payer: Self-pay | Admitting: Family Medicine

## 2023-05-01 DIAGNOSIS — E039 Hypothyroidism, unspecified: Secondary | ICD-10-CM

## 2023-06-26 ENCOUNTER — Other Ambulatory Visit: Payer: Self-pay

## 2023-06-26 DIAGNOSIS — E785 Hyperlipidemia, unspecified: Secondary | ICD-10-CM

## 2023-06-26 MED ORDER — LOVASTATIN 40 MG PO TABS: 40.0000 mg | ORAL_TABLET | Freq: Every day | ORAL | 0 refills | Status: DC

## 2023-08-16 ENCOUNTER — Other Ambulatory Visit: Payer: Self-pay | Admitting: Family Medicine

## 2023-08-16 DIAGNOSIS — D696 Thrombocytopenia, unspecified: Secondary | ICD-10-CM

## 2023-08-16 DIAGNOSIS — E785 Hyperlipidemia, unspecified: Secondary | ICD-10-CM

## 2023-08-16 DIAGNOSIS — E039 Hypothyroidism, unspecified: Secondary | ICD-10-CM

## 2023-08-16 DIAGNOSIS — Z0000 Encounter for general adult medical examination without abnormal findings: Secondary | ICD-10-CM

## 2023-09-01 ENCOUNTER — Telehealth: Payer: Self-pay | Admitting: *Deleted

## 2023-09-01 ENCOUNTER — Other Ambulatory Visit: Payer: Medicare HMO

## 2023-09-01 DIAGNOSIS — E785 Hyperlipidemia, unspecified: Secondary | ICD-10-CM

## 2023-09-01 DIAGNOSIS — D696 Thrombocytopenia, unspecified: Secondary | ICD-10-CM

## 2023-09-01 DIAGNOSIS — E039 Hypothyroidism, unspecified: Secondary | ICD-10-CM

## 2023-09-01 DIAGNOSIS — Z0000 Encounter for general adult medical examination without abnormal findings: Secondary | ICD-10-CM

## 2023-09-01 NOTE — Telephone Encounter (Signed)
Noted  

## 2023-09-01 NOTE — Telephone Encounter (Signed)
Pt in office today for labs and he wanted to let provider know that he does NOT want any refills sent to [facility] pharmacy before the end of the year. 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

## 2023-09-02 LAB — CBC WITH DIFFERENTIAL/PLATELET
Basophils Absolute: 0.1 10*3/uL (ref 0.0–0.2)
Basos: 1 %
EOS (ABSOLUTE): 0.2 10*3/uL (ref 0.0–0.4)
Eos: 3 %
Hematocrit: 46.7 % (ref 37.5–51.0)
Hemoglobin: 15.3 g/dL (ref 13.0–17.7)
Immature Grans (Abs): 0 10*3/uL (ref 0.0–0.1)
Immature Granulocytes: 0 %
Lymphocytes Absolute: 1.3 10*3/uL (ref 0.7–3.1)
Lymphs: 26 %
MCH: 30.4 pg (ref 26.6–33.0)
MCHC: 32.8 g/dL (ref 31.5–35.7)
MCV: 93 fL (ref 79–97)
Monocytes Absolute: 0.6 10*3/uL (ref 0.1–0.9)
Monocytes: 11 %
Neutrophils Absolute: 2.9 10*3/uL (ref 1.4–7.0)
Neutrophils: 59 %
Platelets: 151 10*3/uL (ref 150–450)
RBC: 5.03 x10E6/uL (ref 4.14–5.80)
RDW: 12.3 % (ref 11.6–15.4)
WBC: 5 10*3/uL (ref 3.4–10.8)

## 2023-09-02 LAB — COMPREHENSIVE METABOLIC PANEL
ALT: 14 [IU]/L (ref 0–44)
AST: 21 [IU]/L (ref 0–40)
Albumin: 4.5 g/dL (ref 3.8–4.8)
Alkaline Phosphatase: 92 [IU]/L (ref 44–121)
BUN/Creatinine Ratio: 13 (ref 10–24)
BUN: 14 mg/dL (ref 8–27)
Bilirubin Total: 1.1 mg/dL (ref 0.0–1.2)
CO2: 24 mmol/L (ref 20–29)
Calcium: 9.1 mg/dL (ref 8.6–10.2)
Chloride: 103 mmol/L (ref 96–106)
Creatinine, Ser: 1.04 mg/dL (ref 0.76–1.27)
Globulin, Total: 1.8 g/dL (ref 1.5–4.5)
Glucose: 92 mg/dL (ref 70–99)
Potassium: 4.5 mmol/L (ref 3.5–5.2)
Sodium: 140 mmol/L (ref 134–144)
Total Protein: 6.3 g/dL (ref 6.0–8.5)
eGFR: 75 mL/min/{1.73_m2} (ref >59)

## 2023-09-02 LAB — LIPID PANEL
Chol/HDL Ratio: 2.9 {ratio} (ref 0.0–5.0)
Cholesterol, Total: 144 mg/dL (ref 100–199)
HDL: 49 mg/dL (ref >39)
LDL Chol Calc (NIH): 80 mg/dL (ref 0–99)
Triglycerides: 79 mg/dL (ref 0–149)
VLDL Cholesterol Cal: 15 mg/dL (ref 5–40)

## 2023-09-02 LAB — TSH: TSH: 3.75 u[IU]/mL (ref 0.450–4.500)

## 2023-09-02 LAB — HEMOGLOBIN A1C
Est. average glucose Bld gHb Est-mCnc: 117 mg/dL
Hgb A1c MFr Bld: 5.7 % — ABNORMAL HIGH (ref 4.8–5.6)

## 2023-09-08 ENCOUNTER — Other Ambulatory Visit: Payer: Self-pay | Admitting: Family Medicine

## 2023-09-08 DIAGNOSIS — E785 Hyperlipidemia, unspecified: Secondary | ICD-10-CM

## 2023-09-11 ENCOUNTER — Encounter: Payer: Medicare HMO | Admitting: Family Medicine

## 2023-10-09 ENCOUNTER — Encounter: Payer: Self-pay | Admitting: Family Medicine

## 2023-10-09 ENCOUNTER — Ambulatory Visit (INDEPENDENT_AMBULATORY_CARE_PROVIDER_SITE_OTHER): Payer: Medicare Other | Admitting: Family Medicine

## 2023-10-09 VITALS — BP 137/79 | HR 60 | Temp 97.7°F | Ht 71.0 in | Wt 174.0 lb

## 2023-10-09 DIAGNOSIS — E782 Mixed hyperlipidemia: Secondary | ICD-10-CM | POA: Diagnosis not present

## 2023-10-09 DIAGNOSIS — E039 Hypothyroidism, unspecified: Secondary | ICD-10-CM | POA: Diagnosis not present

## 2023-10-09 MED ORDER — LOVASTATIN 40 MG PO TABS
40.0000 mg | ORAL_TABLET | Freq: Every day | ORAL | 3 refills | Status: DC
Start: 2023-10-09 — End: 2024-07-08

## 2023-10-09 MED ORDER — LEVOTHYROXINE SODIUM 88 MCG PO TABS: ORAL_TABLET | ORAL | 3 refills | Status: DC

## 2023-10-09 NOTE — Assessment & Plan Note (Signed)
TSH within normal limits.  Continue levothyroxine 88 mcg daily, will continue to monitor.

## 2023-10-09 NOTE — Patient Instructions (Signed)
At the pharmacy, I recommend getting an updated tetanus booster.  This will protect you from tetanus for the next 10 years!

## 2023-10-09 NOTE — Progress Notes (Signed)
   Established Patient Office Visit  Subjective   Patient ID: Carlos [name], male    DOB: October 31, 1950  Age: 73 y.o. MRN: [id number]  Chief Complaint  Patient presents with   Follow-up    HPI Carlos [name] is a 73 y.o. male presenting today for follow up of hyperlipidemia, hypothyroidism. Hyperlipidemia: tolerating lovastatin 40 mg daily well with no myalgias or significant side effects.  The 10-year ASCVD risk score (Arnett DK, et al., 2019) is: 23.3% Hypothyroidism: Taking levothyroxine 88 mcg regularly in the AM away from food and vitamins. Denies fatigue, weight changes, heat/cold intolerance, skin/hair changes, bowel changes, CVS symptoms.   Outpatient Medications Prior to Visit  Medication Sig   Probiotic Product (SOLUBLE FIBER/PROBIOTICS PO) Take 1 capsule by mouth 1 day or 1 dose.   vitamin E 200 UNIT capsule Take 100 Units by mouth daily.    [DISCONTINUED] levothyroxine (SYNTHROID) 88 MCG tablet TAKE 1 TABLET EVERY DAY BEFORE BREAKFAST   [DISCONTINUED] lovastatin (MEVACOR) 40 MG tablet TAKE 1 TABLET (40 MG TOTAL) BY MOUTH AT BEDTIME.   [DISCONTINUED] OVER THE COUNTER MEDICATION Take 200 mcg by mouth daily. SELENIUM SUPPLEMENT - OTC DAILY   [DISCONTINUED] OVER THE COUNTER MEDICATION Take 2.5 g by mouth daily. SPIRULINA - OTC DAILY   [DISCONTINUED] OVER THE COUNTER MEDICATION Take 2 g by mouth daily. BARLEY GRASS - OTC DAILY   No facility-administered medications prior to visit.    ROS Negative unless otherwise noted in HPI   Objective:     BP 137/79   Pulse 60   Temp 97.7 F (36.5 C) (Temporal)   Ht 5\' 11"  (1.803 m)   Wt 174 lb (78.9 kg)   SpO2 99%   BMI 24.27 kg/m   Physical Exam Constitutional:      General: He is not in acute distress.    Appearance: Normal appearance.  HENT:     Head: Normocephalic and atraumatic.  Cardiovascular:     Rate and Rhythm: Normal rate and regular rhythm.     Heart sounds: Murmur heard.     No friction rub. No  gallop.  Pulmonary:     Effort: Pulmonary effort is normal. No respiratory distress.     Breath sounds: Normal breath sounds. No wheezing, rhonchi or rales.  Skin:    General: Skin is warm and dry.  Neurological:     Mental Status: He is alert and oriented to person, place, and time.  Psychiatric:        Mood and Affect: Mood normal.     Assessment & Plan:  Acquired hypothyroidism Assessment & Plan: TSH within normal limits.  Continue levothyroxine 88 mcg daily, will continue to monitor.  Orders: -     Levothyroxine Sodium; TAKE 1 TABLET EVERY DAY BEFORE BREAKFAST  Dispense: 90 tablet; Refill: 3  Mixed hyperlipidemia Assessment & Plan: Last lipid panel: LDL 80, HDL 49, triglycerides 79.  Hepatic function within normal limits.  Continue lovastatin 40 mg daily.  Will continue to monitor.  Orders: -     Lovastatin; Take 1 tablet (40 mg total) by mouth at bedtime.  Dispense: 90 tablet; Refill: 3  Discussed recommendations for tetanus booster, patient agreeable to having this updated at his local pharmacy.  Return in about 6 months (around 04/07/2024) for follow-up for HLD, thyroid, blood pressure, fasting labs 1 week before.    Melida Quitter, PA

## 2023-10-09 NOTE — Assessment & Plan Note (Signed)
Last lipid panel: LDL 80, HDL 49, triglycerides 79.  Hepatic function within normal limits.  Continue lovastatin 40 mg daily.  Will continue to monitor.

## 2023-10-19 ENCOUNTER — Encounter: Payer: Self-pay | Admitting: Family Medicine

## 2023-10-20 ENCOUNTER — Encounter: Payer: Self-pay | Admitting: Family Medicine

## 2024-03-22 ENCOUNTER — Other Ambulatory Visit: Payer: Self-pay

## 2024-03-22 DIAGNOSIS — Z130 Encounter for screening for diseases of the blood and blood-forming organs and certain disorders involving the immune mechanism: Secondary | ICD-10-CM

## 2024-03-22 DIAGNOSIS — E039 Hypothyroidism, unspecified: Secondary | ICD-10-CM

## 2024-03-22 DIAGNOSIS — D696 Thrombocytopenia, unspecified: Secondary | ICD-10-CM

## 2024-03-22 DIAGNOSIS — E785 Hyperlipidemia, unspecified: Secondary | ICD-10-CM

## 2024-04-01 ENCOUNTER — Other Ambulatory Visit: Payer: Self-pay

## 2024-04-01 DIAGNOSIS — E039 Hypothyroidism, unspecified: Secondary | ICD-10-CM

## 2024-04-01 DIAGNOSIS — Z130 Encounter for screening for diseases of the blood and blood-forming organs and certain disorders involving the immune mechanism: Secondary | ICD-10-CM

## 2024-04-01 DIAGNOSIS — E785 Hyperlipidemia, unspecified: Secondary | ICD-10-CM

## 2024-04-01 DIAGNOSIS — D696 Thrombocytopenia, unspecified: Secondary | ICD-10-CM

## 2024-04-01 NOTE — Addendum Note (Signed)
 Addended by: ZIMMERMAN RUMPLE, Aeron Lheureux D on: 04/01/2024 05:33 PM   Modules accepted: Orders

## 2024-04-02 ENCOUNTER — Other Ambulatory Visit

## 2024-04-02 DIAGNOSIS — Z1329 Encounter for screening for other suspected endocrine disorder: Secondary | ICD-10-CM | POA: Diagnosis not present

## 2024-04-02 DIAGNOSIS — D696 Thrombocytopenia, unspecified: Secondary | ICD-10-CM | POA: Diagnosis not present

## 2024-04-02 DIAGNOSIS — E039 Hypothyroidism, unspecified: Secondary | ICD-10-CM | POA: Diagnosis not present

## 2024-04-02 DIAGNOSIS — E785 Hyperlipidemia, unspecified: Secondary | ICD-10-CM

## 2024-04-02 DIAGNOSIS — Z130 Encounter for screening for diseases of the blood and blood-forming organs and certain disorders involving the immune mechanism: Secondary | ICD-10-CM | POA: Diagnosis not present

## 2024-04-02 DIAGNOSIS — Z13228 Encounter for screening for other metabolic disorders: Secondary | ICD-10-CM | POA: Diagnosis not present

## 2024-04-02 DIAGNOSIS — Z1321 Encounter for screening for nutritional disorder: Secondary | ICD-10-CM | POA: Diagnosis not present

## 2024-04-03 ENCOUNTER — Ambulatory Visit: Payer: Self-pay

## 2024-04-03 LAB — CBC WITH DIFFERENTIAL/PLATELET
Basophils Absolute: 0.1 x10E3/uL (ref 0.0–0.2)
Basos: 1 %
EOS (ABSOLUTE): 0.2 x10E3/uL (ref 0.0–0.4)
Eos: 3 %
Hematocrit: 42.9 % (ref 37.5–51.0)
Hemoglobin: 14.5 g/dL (ref 13.0–17.7)
Immature Grans (Abs): 0 x10E3/uL (ref 0.0–0.1)
Immature Granulocytes: 0 %
Lymphocytes Absolute: 1.5 x10E3/uL (ref 0.7–3.1)
Lymphs: 33 %
MCH: 31.3 pg (ref 26.6–33.0)
MCHC: 33.8 g/dL (ref 31.5–35.7)
MCV: 93 fL (ref 79–97)
Monocytes Absolute: 0.6 x10E3/uL (ref 0.1–0.9)
Monocytes: 12 %
Neutrophils Absolute: 2.4 x10E3/uL (ref 1.4–7.0)
Neutrophils: 51 %
Platelets: 129 x10E3/uL — ABNORMAL LOW (ref 150–450)
RBC: 4.63 x10E6/uL (ref 4.14–5.80)
RDW: 12.6 % (ref 11.6–15.4)
WBC: 4.7 x10E3/uL (ref 3.4–10.8)

## 2024-04-03 LAB — COMPREHENSIVE METABOLIC PANEL WITH GFR
ALT: 17 IU/L (ref 0–44)
AST: 18 IU/L (ref 0–40)
Albumin: 4.2 g/dL (ref 3.8–4.8)
Alkaline Phosphatase: 73 IU/L (ref 44–121)
BUN/Creatinine Ratio: 14 (ref 10–24)
BUN: 14 mg/dL (ref 8–27)
Bilirubin Total: 0.8 mg/dL (ref 0.0–1.2)
CO2: 22 mmol/L (ref 20–29)
Calcium: 9 mg/dL (ref 8.6–10.2)
Chloride: 107 mmol/L — ABNORMAL HIGH (ref 96–106)
Creatinine, Ser: 1.03 mg/dL (ref 0.76–1.27)
Globulin, Total: 1.7 g/dL (ref 1.5–4.5)
Glucose: 89 mg/dL (ref 70–99)
Potassium: 4.2 mmol/L (ref 3.5–5.2)
Sodium: 143 mmol/L (ref 134–144)
Total Protein: 5.9 g/dL — ABNORMAL LOW (ref 6.0–8.5)
eGFR: 76 mL/min/1.73 (ref >59)

## 2024-04-03 LAB — LIPID PANEL
Chol/HDL Ratio: 3.1 ratio (ref 0.0–5.0)
Cholesterol, Total: 136 mg/dL (ref 100–199)
HDL: 44 mg/dL (ref >39)
LDL Chol Calc (NIH): 74 mg/dL (ref 0–99)
Triglycerides: 93 mg/dL (ref 0–149)
VLDL Cholesterol Cal: 18 mg/dL (ref 5–40)

## 2024-04-03 LAB — VITAMIN D 25 HYDROXY (VIT D DEFICIENCY, FRACTURES): Vit D, 25-Hydroxy: 46.8 ng/mL (ref 30.0–100.0)

## 2024-04-03 LAB — HEMOGLOBIN A1C
Est. average glucose Bld gHb Est-mCnc: 114 mg/dL
Hgb A1c MFr Bld: 5.6 % (ref 4.8–5.6)

## 2024-04-03 LAB — TSH: TSH: 2.68 u[IU]/mL (ref 0.450–4.500)

## 2024-04-08 ENCOUNTER — Ambulatory Visit (INDEPENDENT_AMBULATORY_CARE_PROVIDER_SITE_OTHER)

## 2024-04-08 VITALS — BP 123/66 | HR 75 | Temp 97.5°F | Ht 71.0 in | Wt 168.1 lb

## 2024-04-08 DIAGNOSIS — E039 Hypothyroidism, unspecified: Secondary | ICD-10-CM

## 2024-04-08 DIAGNOSIS — R011 Cardiac murmur, unspecified: Secondary | ICD-10-CM | POA: Diagnosis not present

## 2024-04-08 DIAGNOSIS — E7849 Other hyperlipidemia: Secondary | ICD-10-CM

## 2024-04-08 DIAGNOSIS — R030 Elevated blood-pressure reading, without diagnosis of hypertension: Secondary | ICD-10-CM | POA: Insufficient documentation

## 2024-04-08 NOTE — Assessment & Plan Note (Signed)
 BP Goal < 130/80. BP well within goal in office today. Not currently on any BP-lowering medications. Will cont to monitor.

## 2024-04-08 NOTE — Progress Notes (Signed)
 Established Patient Office Visit  Subjective   Patient ID: Carlos [name], male    DOB: Nov 25, 1950  Age: 73 y.o. MRN: [id number]  Chief Complaint  Patient presents with   Medical Management of Chronic Issues    HPI  Carlos [name] is a 73 y.o. y/o male who presents to the clinic today for follow up on elevated BP, HLD, thyroid .  Elevated BP: Patient has a hx of elevated BP in office without diagnosis of HTN. Patient denies CP, palpitations, HA, vision changes, or edema. Does not check BP regularly at home.   HLD: Patient currently taking lovastatin  daily for their cholesterol. Reports excellent compliance with this medication. Denies side effects/new myalgias.   Thyroid : Patient currently taking 88 mcg of Levothyroxine  daily. Reports excellent compliance with this medication. Reports taking this medication in the mornings away from other medications, food, or vitamins. Denies side effects of fatigue, skin changes, weight changes, or bowel habit changes.       ROS Per HPI.    Objective:     BP 123/66   Pulse 75   Temp (!) 97.5 F (36.4 C) (Oral)   Ht 5' 11 (1.803 m)   Wt 168 lb 1.3 oz (76.2 kg)   SpO2 98%   BMI 23.44 kg/m    Physical Exam Constitutional:      General: He is not in acute distress.    Appearance: Normal appearance.  Cardiovascular:     Rate and Rhythm: Normal rate and regular rhythm.     Heart sounds: Murmur (Systolic blowing murmur appreciated) heard.     No friction rub. No gallop.  Pulmonary:     Effort: Pulmonary effort is normal. No respiratory distress.     Breath sounds: Normal breath sounds.  Musculoskeletal:        General: No swelling.  Skin:    General: Skin is warm and dry.  Neurological:     General: No focal deficit present.     Mental Status: He is alert.  Psychiatric:        Mood and Affect: Mood normal.        Behavior: Behavior normal.        Thought Content: Thought content normal.    No results found  for any visits on 04/08/24.  Last CBC Lab Results  Component Value Date   WBC 4.7 04/02/2024   HGB 14.5 04/02/2024   HCT 42.9 04/02/2024   MCV 93 04/02/2024   MCH 31.3 04/02/2024   RDW 12.6 04/02/2024   PLT 129 (L) 04/02/2024   Last metabolic panel Lab Results  Component Value Date   GLUCOSE 89 04/02/2024   NA 143 04/02/2024   K 4.2 04/02/2024   CL 107 (H) 04/02/2024   CO2 22 04/02/2024   BUN 14 04/02/2024   CREATININE 1.03 04/02/2024   EGFR 76 04/02/2024   CALCIUM 9.0 04/02/2024   PROT 5.9 (L) 04/02/2024   ALBUMIN 4.2 04/02/2024   LABGLOB 1.7 04/02/2024   AGRATIO 2.6 (H) 08/31/2022   BILITOT 0.8 04/02/2024   ALKPHOS 73 04/02/2024   AST 18 04/02/2024   ALT 17 04/02/2024   Last lipids Lab Results  Component Value Date   CHOL 136 04/02/2024   HDL 44 04/02/2024   LDLCALC 74 04/02/2024   TRIG 93 04/02/2024   CHOLHDL 3.1 04/02/2024   Last hemoglobin A1c Lab Results  Component Value Date   HGBA1C 5.6 04/02/2024   Last thyroid  functions Lab Results  Component Value  Date   TSH 2.680 04/02/2024   T3TOTAL 95 08/31/2022   T4TOTAL 8.7 06/07/2018      The 10-year ASCVD risk score (Arnett DK, et al., 2019) is: 21.5%    Assessment & Plan:   Heart murmur Assessment & Plan: Holosystolic blowing murmur appreciated on PE. Patient reports he has had this since he was 33. Declines Echo at this time.   Other hyperlipidemia Assessment & Plan: Last lipid panel: LDL 74, HDL 44, Trig 93. Continue Lovastatin  40 mg daily. CMP WNL. Will cont to monitor. The 10-year ASCVD risk score (Arnett DK, et al., 2019) is: 21.5%    Acquired hypothyroidism Assessment & Plan: Last TSH WNL at 2.680. Continue levothyroxine  88 mcg daily.    Elevated blood pressure reading in office without diagnosis of hypertension Assessment & Plan: BP Goal < 130/80. BP well within goal in office today. Not currently on any BP-lowering medications. Will cont to monitor.     Return in about 6  months (around 10/09/2024) for HLD, thyroid , elevated BP.    Saddie JULIANNA Sacks, PA-C

## 2024-04-08 NOTE — Assessment & Plan Note (Signed)
 Last lipid panel: LDL 74, HDL 44, Trig 93. Continue Lovastatin  40 mg daily. CMP WNL. Will cont to monitor. The 10-year ASCVD risk score (Arnett DK, et al., 2019) is: 21.5%

## 2024-04-08 NOTE — Assessment & Plan Note (Signed)
 Last TSH WNL at 2.680. Continue levothyroxine  88 mcg daily.

## 2024-04-08 NOTE — Assessment & Plan Note (Signed)
 Holosystolic blowing murmur appreciated on PE. Patient reports he has had this since he was 37. Declines Echo at this time.

## 2024-04-08 NOTE — Patient Instructions (Signed)
 It was nice to see you today!  As we discussed in clinic:  -Continue your current medications daily as prescribed.  -Your blood pressure, thyroid , and cholesterol all look great, which is awesome news!   I will plan to see you back in 6 months for a follow up! It was nice to meet you!  If you have any problems before your next visit feel free to message me via MyChart (minor issues or questions) or call the office, otherwise you may reach out to schedule an office visit.  Thank you! Saddie Sacks, PA-C

## 2024-04-23 ENCOUNTER — Ambulatory Visit: Admitting: Family Medicine

## 2024-07-01 DIAGNOSIS — H5213 Myopia, bilateral: Secondary | ICD-10-CM | POA: Diagnosis not present

## 2024-07-08 ENCOUNTER — Telehealth: Payer: Self-pay

## 2024-07-08 DIAGNOSIS — E782 Mixed hyperlipidemia: Secondary | ICD-10-CM

## 2024-07-08 DIAGNOSIS — E039 Hypothyroidism, unspecified: Secondary | ICD-10-CM

## 2024-07-08 MED ORDER — LEVOTHYROXINE SODIUM 88 MCG PO TABS
ORAL_TABLET | ORAL | 3 refills | Status: AC
Start: 2024-07-08 — End: ?

## 2024-07-08 MED ORDER — LOVASTATIN 40 MG PO TABS: 40.0000 mg | ORAL_TABLET | Freq: Every day | ORAL | 3 refills | Status: AC

## 2024-07-08 NOTE — Telephone Encounter (Signed)
 Copied from CRM [id number]. Topic: Clinical - Medication Refill >> Jul 08, 2024 11:23 AM Kevelyn M wrote: Medication: levothyroxine  (SYNTHROID ) 88 MCG tablet, lovastatin  (MEVACOR ) 40 MG tablet  Has the patient contacted their pharmacy? Yes (Agent: If no, request that the patient contact the pharmacy for the refill. If patient does not wish to contact the pharmacy document the reason why and proceed with request.) (Agent: If yes, when and what did the pharmacy advise?)  This is the patient's preferred pharmacy:  [facility] (Patient could not give additional information)  Is this the correct pharmacy for this prescription? Yes If no, delete pharmacy and type the correct one.   Has the prescription been filled recently? No  Is the patient out of the medication? No  Has the patient been seen for an appointment in the last year OR does the patient have an upcoming appointment? Yes  Can we respond through MyChart? No  Agent: Please be advised that Rx refills may take up to 3 business days. We ask that you follow-up with your pharmacy.

## 2024-07-11 ENCOUNTER — Ambulatory Visit

## 2024-07-11 DIAGNOSIS — Z0000 Encounter for general adult medical examination without abnormal findings: Secondary | ICD-10-CM

## 2024-07-11 NOTE — Progress Notes (Signed)
 Subjective:   Elric Tirado is a 73 y.o. who presents for a Medicare Wellness preventive visit.  As a reminder, Annual Wellness Visits don't include a physical exam, and some assessments may be limited, especially if this visit is performed virtually. We may recommend an in-person follow-up visit with your provider if needed.  Visit Complete: Virtual I connected with  Glendia Arloa Minus on 07/11/24 by a video and audio enabled telemedicine application and verified that I am speaking with the correct person using two identifiers.  Patient Location: Home  Provider Location: Home Office  I discussed the limitations of evaluation and management by telemedicine. The patient expressed understanding and agreed to proceed.  Vital Signs: Because this visit was a virtual/telehealth visit, some criteria may be missing or patient reported. Any vitals not documented were not able to be obtained and vitals that have been documented are patient reported.    Persons Participating in Visit: Patient.  AWV Questionnaire: No: Patient Medicare AWV questionnaire was not completed prior to this visit.  Cardiac Risk Factors include: advanced age (>24men, >16 women);dyslipidemia;male gender     Objective:    Today's Vitals   There is no height or weight on file to calculate BMI.     07/11/2024    4:17 PM 12/15/2016    1:13 PM 04/21/2016   10:50 AM 03/04/2015   10:57 AM 11/13/2014   12:53 PM 07/10/2014    2:51 PM  Advanced Directives  Does Patient Have a Medical Advance Directive? Yes Yes  No  No  Yes  Yes   Type of Estate Agent of [place];Living will Healthcare Power of [place];Living will   Living will;Healthcare Power of [employer] Power of Attorney   Does patient want to make changes to medical advance directive?     No - Patient declined  Yes - information given   Copy of Healthcare Power of Attorney in Chart? No - copy requested    No - copy requested   No - copy requested   Would patient like information on creating a medical advance directive?   No - patient declined information  No - patient declined information        Data saved with a previous flowsheet row definition    Current Medications (verified) Outpatient Encounter Medications as of 07/11/2024  Medication Sig   esomeprazole  (NEXIUM ) 20 MG capsule Take 20 mg by mouth every other day.   levothyroxine  (SYNTHROID ) 88 MCG tablet TAKE 1 TABLET EVERY DAY BEFORE BREAKFAST   lovastatin  (MEVACOR ) 40 MG tablet Take 1 tablet (40 mg total) by mouth at bedtime.   Probiotic Product (SOLUBLE FIBER/PROBIOTICS PO) Take 1 capsule by mouth 1 day or 1 dose.   SPIRULINA PO Take 6 tablets by mouth daily.   vitamin E 200 UNIT capsule Take 100 Units by mouth daily.    No facility-administered encounter medications on file as of 07/11/2024.    Allergies (verified) Patient has no known allergies.   History: Past Medical History:  Diagnosis Date   HLD (hyperlipidemia)    Hypothyroidism    Irregular heartbeat    self-reported slow heartbeat   Thrombocytopenia 10/09/2022   Past Surgical History:  Procedure Laterality Date   NO PAST SURGERIES     as of 02/2014   Family History  Problem Relation Age of Onset   Prostate cancer Father        Spread to bones   Alcoholism Other  Mother and Father   COPD Brother        Emphysema   Diabetes Brother        neither required medicine   Heart disease Other        Grandparents   Early death Other        Mother, 68 (PNA); Father, 53 (cancer)   Hypertension Brother    Hyperlipidemia Brother    Thyroid  disease Other        Brother and grandfather   Colon cancer Neg Hx    Social History   Socioeconomic History   Marital status: Single    Spouse name: Not on file   Number of children: 1   Years of education: 13   Highest education level: Not on file  Occupational History   Occupation: retired-brick yard   Tobacco Use   Smoking  status: Never    Passive exposure: Never   Smokeless tobacco: Current    Types: Chew  Vaping Use   Vaping status: Never Used  Substance and Sexual Activity   Alcohol use: No    Alcohol/week: 0.0 standard drinks of alcohol   Drug use: No   Sexual activity: Never  Other Topics Concern   Not on file  Social History Narrative   Not on file   Social Drivers of Health   Financial Resource Strain: Low Risk  (07/11/2024)   Overall Financial Resource Strain (CARDIA)    Difficulty of Paying Living Expenses: Not hard at all  Food Insecurity: No Food Insecurity (07/11/2024)   Hunger Vital Sign    Worried About Running Out of Food in the Last Year: Never true    Ran Out of Food in the Last Year: Never true  Transportation Needs: No Transportation Needs (07/11/2024)   PRAPARE - Administrator, Civil Service (Medical): No    Lack of Transportation (Non-Medical): No  Physical Activity: Sufficiently Active (07/11/2024)   Exercise Vital Sign    Days of Exercise per Week: 7 days    Minutes of Exercise per Session: 60 min  Stress: No Stress Concern Present (07/11/2024)   [employer] of Occupational Health - Occupational Stress Questionnaire    Feeling of Stress: Not at all  Social Connections: Socially Isolated (07/11/2024)   Social Connection and Isolation Panel    Frequency of Communication with Friends and Family: Once a week    Frequency of Social Gatherings with Friends and Family: Never    Attends Religious Services: Never    Database Administrator or Organizations: No    Attends Engineer, Structural: Never    Marital Status: Divorced    Tobacco Counseling Ready to quit: Not Answered Counseling given: Not Answered    Clinical Intake:  Pre-visit preparation completed: Yes  Pain : No/denies pain     Nutritional Risks: None Diabetes: No  Lab Results  Component Value Date   HGBA1C 5.6 04/02/2024   HGBA1C 5.7 (H) 09/01/2023   HGBA1C 5.6  08/31/2022     How often do you need to have someone help you when you read instructions, pamphlets, or other written materials from your doctor or pharmacy?: 1 - Never  Interpreter Needed?: No  Information entered by :: NAllen LPN   Activities of Daily Living     07/11/2024    4:09 PM  In your present state of health, do you have any difficulty performing the following activities:  Hearing? 0  Vision? 0  Difficulty concentrating or making  decisions? 0  Walking or climbing stairs? 0  Dressing or bathing? 0  Doing errands, shopping? 0  Preparing Food and eating ? N  Using the Toilet? N  In the past six months, have you accidently leaked urine? N  Do you have problems with loss of bowel control? N  Managing your Medications? N  Managing your Finances? N  Housekeeping or managing your Housekeeping? N    Patient Care Team: Gayle Saddie JULIANNA DEVONNA as PCP - General (Physician Assistant) Avram Lupita BRAVO, MD as Consulting Physician (Gastroenterology)  I have updated your Care Teams any recent Medical Services you may have received from other providers in the past year.     Assessment:   This is a routine wellness examination for Sholom.  Hearing/Vision screen Hearing Screening - Comments:: Denies hearing issues Vision Screening - Comments:: Regular eye exams, [facility]   Goals Addressed             This Visit's Progress    Patient Stated       07/11/2024, denies goals       Depression Screen     07/11/2024    4:19 PM 04/08/2024   11:13 AM 10/09/2023   10:24 AM 09/07/2022    1:32 PM 09/01/2021   11:07 AM 08/17/2020   11:37 AM 08/15/2019    1:03 PM  PHQ 2/9 Scores  PHQ - 2 Score 0 0 0 0 0 0 0  PHQ- 9 Score 0  0 0 0 0 1    Fall Risk     07/11/2024    4:19 PM 04/08/2024   11:13 AM 10/09/2023   10:24 AM 09/07/2022    1:32 PM 09/01/2021   11:06 AM  Fall Risk   Falls in the past year? 0 0 0 0 0  Number falls in past yr: 0  0 0 0  Injury with Fall? 0  0 0 0   Risk for fall due to : Medication side effect No Fall Risks No Fall Risks  No Fall Risks  Follow up Falls evaluation completed;Falls prevention discussed Falls evaluation completed Falls evaluation completed  Falls evaluation completed      Data saved with a previous flowsheet row definition    MEDICARE RISK AT HOME:  Medicare Risk at Home Any stairs in or around the home?: Yes If so, are there any without handrails?: No Home free of loose throw rugs in walkways, pet beds, electrical cords, etc?: Yes Adequate lighting in your home to reduce risk of falls?: Yes Life alert?: No Use of a cane, walker or w/c?: No Grab bars in the bathroom?: No Shower chair or bench in shower?: No Elevated toilet seat or a handicapped toilet?: No  TIMED UP AND GO:  Was the test performed?  No  Cognitive Function: 6CIT completed    07/10/2014    2:00 PM  MMSE - Mini Mental State Exam  Orientation to time 5   Orientation to Place 5   Registration 3   Attention/ Calculation 5   Recall 3   Language- name 2 objects 2   Language- repeat 1  Language- follow 3 step command 3   Language- read & follow direction 1   Write a sentence 1   Copy design 1   Total score 30      Data saved with a previous flowsheet row definition        07/11/2024    4:20 PM 09/07/2022    1:19 PM  09/01/2021   10:58 AM 08/17/2020   11:39 AM 08/15/2019    1:06 PM  6CIT Screen  What Year? 0 points 0 points 0 points 0 points 0 points  What month? 0 points 0 points 0 points 0 points 0 points  What time? 0 points 0 points 0 points 0 points 0 points  Count back from 20 0 points 0 points 0 points 0 points 0 points  Months in reverse 0 points 0 points 0 points 0 points 0 points  Repeat phrase 0 points 0 points 0 points 0 points 0 points  Total Score 0 points 0 points 0 points 0 points 0 points    Immunizations Immunization History  Administered Date(s) Administered   Fluad Quad(high Dose 65+) 04/21/2021, 05/30/2022    INFLUENZA, HIGH DOSE SEASONAL PF 06/04/2015, 06/06/2016, 05/21/2019, 06/08/2020   Influenza-Unspecified 05/13/2013, 06/23/2014, 06/12/2017, 05/29/2023   Moderna SARS-COV2 Booster Vaccination 08/24/2020, 12/28/2020   Moderna Sars-Covid-2 Vaccination 01/21/2020, 02/18/2020, 12/28/2020, 06/20/2022, 05/29/2023   Pfizer Covid-19 Vaccine Bivalent Booster 57yrs & up 06/07/2021   Pneumococcal Conjugate-13 06/22/2018   Pneumococcal Polysaccharide-23 01/06/2014, 12/15/2014   RSV,unspecified 05/30/2022   Tdap 09/12/2012   Zoster Recombinant(Shingrix) 03/19/2018, 06/12/2018   Zoster, Live 08/12/2013    Screening Tests Health Maintenance  Topic Date Due   DTaP/Tdap/Td (2 - Td or Tdap) 09/12/2022   Influenza Vaccine  04/12/2024   COVID-19 Vaccine (7 - 2025-26 season) 05/13/2024   Colonoscopy  11/26/2024   Medicare Annual Wellness (AWV)  07/11/2025   Pneumococcal Vaccine: 50+ Years  Completed   Hepatitis C Screening  Completed   Zoster Vaccines- Shingrix  Completed   Meningococcal B Vaccine  Aged Out    Health Maintenance Items Addressed: Vaccines Due: TDAP  Additional Screening:  Vision Screening: Recommended annual ophthalmology exams for early detection of glaucoma and other disorders of the eye. Is the patient up to date with their annual eye exam?  Yes  Who is the provider or what is the name of the office in which the patient attends annual eye exams? [facility]  Dental Screening: Recommended annual dental exams for proper oral hygiene  Community Resource Referral / Chronic Care Management: CRR required this visit?  No   CCM required this visit?  No   Plan:    I have personally reviewed and noted the following in the patient's chart:   Medical and social history Use of alcohol, tobacco or illicit drugs  Current medications and supplements including opioid prescriptions. Patient is not currently taking opioid prescriptions. Functional ability and status Nutritional  status Physical activity Advanced directives List of other physicians Hospitalizations, surgeries, and ER visits in previous 12 months Vitals Screenings to include cognitive, depression, and falls Referrals and appointments  In addition, I have reviewed and discussed with patient certain preventive protocols, quality metrics, and best practice recommendations. A written personalized care plan for preventive services as well as general preventive health recommendations were provided to patient.   Ardella FORBES Dawn, LPN   [id number]   After Visit Summary: (Pick Up) Due to this being a telephonic visit, with patients personalized plan was offered to patient and patient has requested to Pick up at office.  Notes: Nothing significant to report at this time.

## 2024-07-11 NOTE — Patient Instructions (Signed)
 Carlos [name],  Thank you for taking the time for your Medicare Wellness Visit. I appreciate your continued commitment to your health goals. Please review the care plan we discussed, and feel free to reach out if I can assist you further.  Medicare recommends these wellness visits once per year to help you and your care team stay ahead of potential health issues. These visits are designed to focus on prevention, allowing your provider to concentrate on managing your acute and chronic conditions during your regular appointments.  Please note that Annual Wellness Visits do not include a physical exam. Some assessments may be limited, especially if the visit was conducted virtually. If needed, we may recommend a separate in-person follow-up with your provider.  Ongoing Care Seeing your primary care provider every 3 to 6 months helps us  monitor your health and provide consistent, personalized care.   Referrals If a referral was made during today's visit and you haven't received any updates within two weeks, please contact the referred provider directly to check on the status.  Recommended Screenings:  Health Maintenance  Topic Date Due   DTaP/Tdap/Td vaccine (2 - Td or Tdap) 09/12/2022   Flu Shot  04/12/2024   COVID-19 Vaccine (7 - 2025-26 season) 05/13/2024   Colon Cancer Screening  11/26/2024   Medicare Annual Wellness Visit  07/11/2025   Pneumococcal Vaccine for age over 67  Completed   Hepatitis C Screening  Completed   Zoster (Shingles) Vaccine  Completed   Meningitis B Vaccine  Aged Out       07/11/2024    4:17 PM  Advanced Directives  Does Patient Have a Medical Advance Directive? Yes  Type of Estate Agent of [place];Living will  Copy of Healthcare Power of Attorney in Chart? No - copy requested   Advance Care Planning is important because it: Ensures you receive medical care that aligns with your values, goals, and preferences. Provides guidance to  your family and loved ones, reducing the emotional burden of decision-making during critical moments.  Vision: Annual vision screenings are recommended for early detection of glaucoma, cataracts, and diabetic retinopathy. These exams can also reveal signs of chronic conditions such as diabetes and high blood pressure.  Dental: Annual dental screenings help detect early signs of oral cancer, gum disease, and other conditions linked to overall health, including heart disease and diabetes.  Please see the attached documents for additional preventive care recommendations.

## 2024-10-09 ENCOUNTER — Ambulatory Visit

## 2025-07-24 ENCOUNTER — Ambulatory Visit
